# Patient Record
Sex: Female | Born: 1980 | Race: White | Hispanic: No | Marital: Married | Smoking: Never smoker
Health system: Southern US, Community
[De-identification: ages and names within clinical notes are randomized; demographics above are authoritative.]

## PROBLEM LIST (undated history)

## (undated) DIAGNOSIS — R112 Nausea with vomiting, unspecified: Secondary | ICD-10-CM

## (undated) DIAGNOSIS — Z8639 Personal history of other endocrine, nutritional and metabolic disease: Secondary | ICD-10-CM

## (undated) DIAGNOSIS — Z8349 Family history of other endocrine, nutritional and metabolic diseases: Secondary | ICD-10-CM

## (undated) DIAGNOSIS — O09519 Supervision of elderly primigravida, unspecified trimester: Secondary | ICD-10-CM

## (undated) DIAGNOSIS — Z9889 Other specified postprocedural states: Secondary | ICD-10-CM

## (undated) DIAGNOSIS — K429 Umbilical hernia without obstruction or gangrene: Secondary | ICD-10-CM

## (undated) DIAGNOSIS — E162 Hypoglycemia, unspecified: Secondary | ICD-10-CM

## (undated) HISTORY — DX: Other specified postprocedural states: Z98.890

## (undated) HISTORY — PX: WISDOM TOOTH EXTRACTION: SHX21

## (undated) HISTORY — DX: Hypoglycemia, unspecified: E16.2

## (undated) HISTORY — DX: Nausea with vomiting, unspecified: R11.2

## (undated) HISTORY — DX: Umbilical hernia without obstruction or gangrene: K42.9

## (undated) HISTORY — DX: Supervision of elderly primigravida, unspecified trimester: O09.519

## (undated) HISTORY — PX: UMBILICAL HERNIA REPAIR: SHX196

## (undated) HISTORY — DX: Personal history of other endocrine, nutritional and metabolic disease: Z86.39

## (undated) HISTORY — DX: Family history of other endocrine, nutritional and metabolic diseases: Z83.49

## (undated) HISTORY — PX: HERNIA REPAIR: SHX51

## (undated) HISTORY — PX: BACK SURGERY: SHX140

---

## 2006-01-29 ENCOUNTER — Inpatient Hospital Stay (HOSPITAL_COMMUNITY): Admission: AD | Admit: 2006-01-29 | Discharge: 2006-01-31 | Payer: Self-pay | Admitting: Obstetrics and Gynecology

## 2006-04-03 ENCOUNTER — Emergency Department (HOSPITAL_COMMUNITY): Admission: EM | Admit: 2006-04-03 | Discharge: 2006-04-03 | Payer: Self-pay | Admitting: Emergency Medicine

## 2007-02-12 ENCOUNTER — Emergency Department (HOSPITAL_COMMUNITY): Admission: EM | Admit: 2007-02-12 | Discharge: 2007-02-12 | Payer: Self-pay | Admitting: Family Medicine

## 2007-05-15 IMAGING — CT CT ABDOMEN W/O CM
1 series · 15 of 32 positions shown, 19 images · IV contrast (agent unspecified)
Comparison: none

CLINICAL DATA: 8 weeks post partum.  Left flank and left lower quadrant pain.  Please evaluate. 
 ABDOMEN CT WITHOUT CONTRAST:
TECHNIQUE: Multidetector CT imaging of the abdomen was performed following the standard protocol without IV contrast.
TECHNIQUE: Multidetector CT imaging of the pelvis was performed following the standard protocol without IV contrast.

[Series 2: stone_wo 5.0 b40f st · axial · 0.61mm/px · z∈[-444,-92]mm · 15 of 99 slices shown, 19 images]
[im 7/99  soft-tissue]
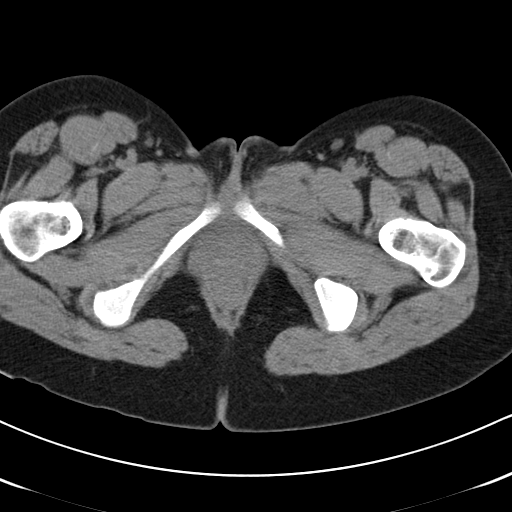
[im 7/99  bone]
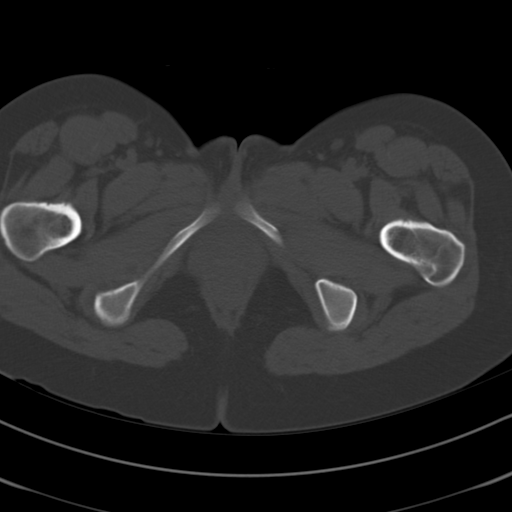
[im 13/99  soft-tissue]
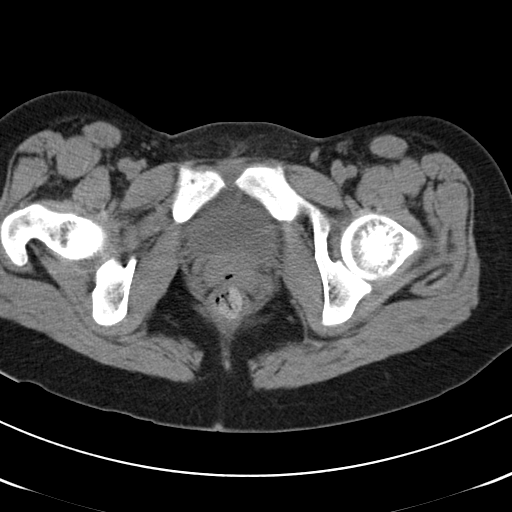
[im 19/99  soft-tissue]
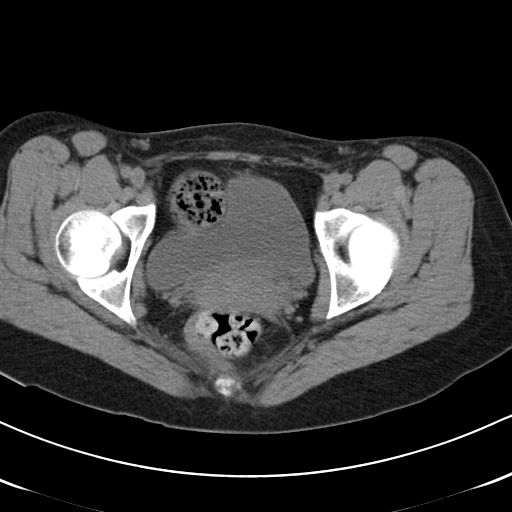
[im 29/99  soft-tissue]
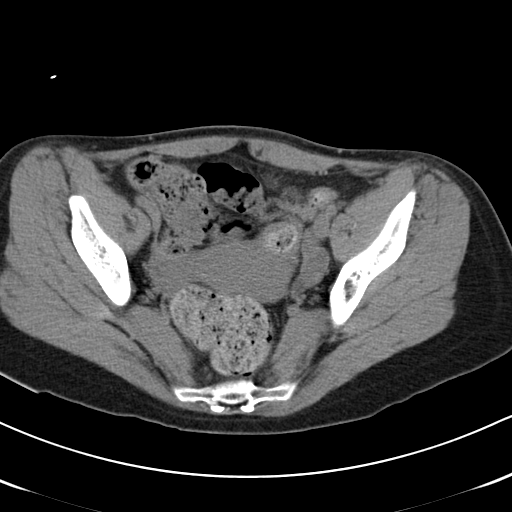
[im 35/99  soft-tissue]
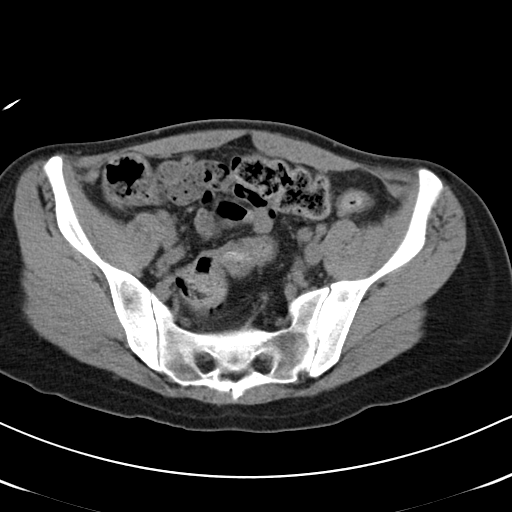
[im 42/99  soft-tissue]
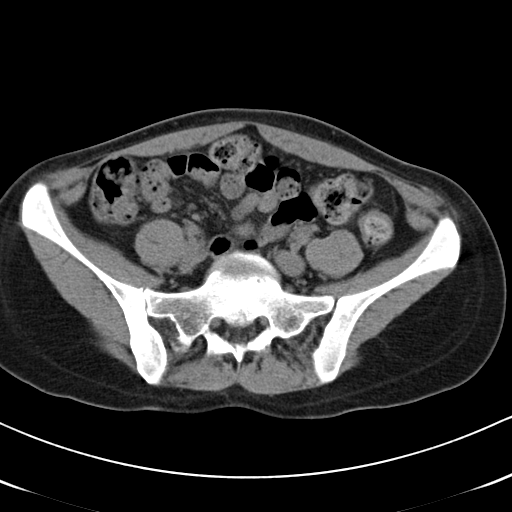
[im 51/99  soft-tissue]
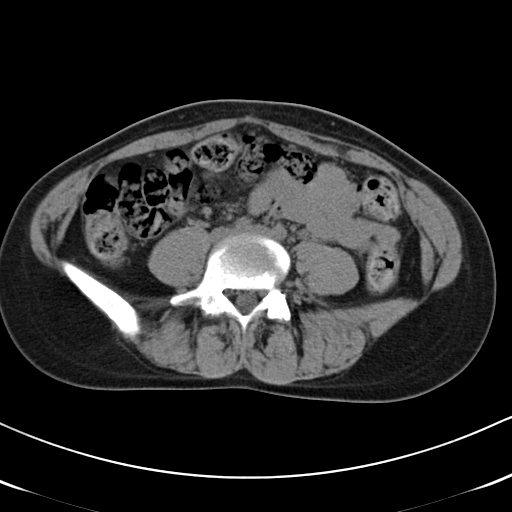
[im 57/99  soft-tissue]
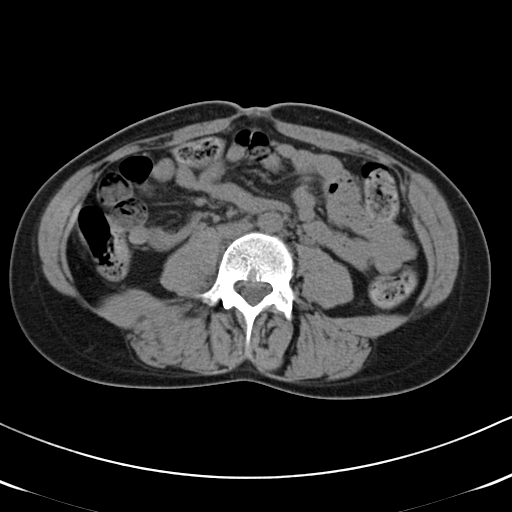
[im 64/99  soft-tissue]
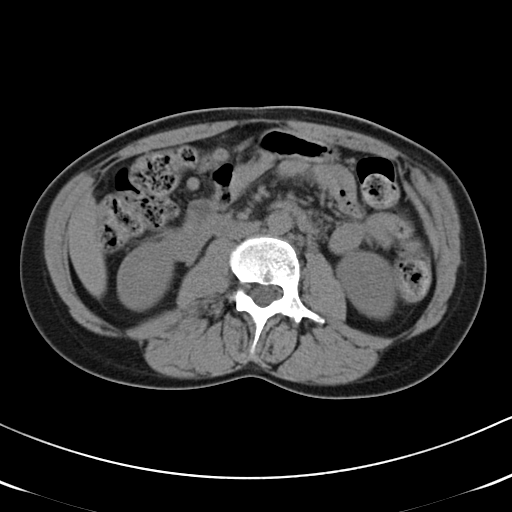
[im 64/99  bone]
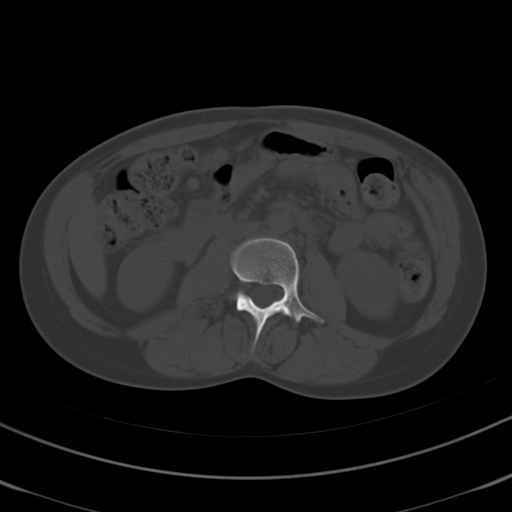
[im 70/99  soft-tissue]
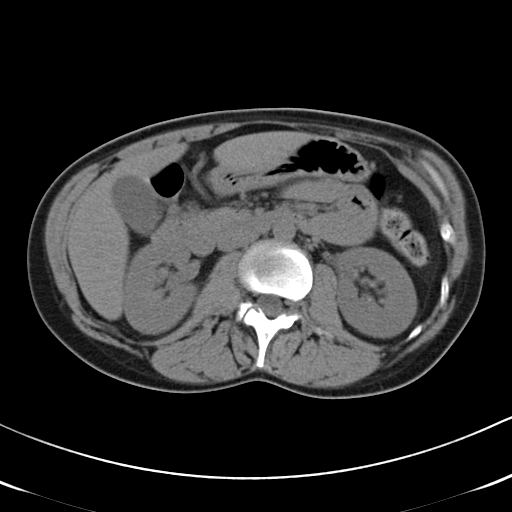
[im 80/99  soft-tissue]
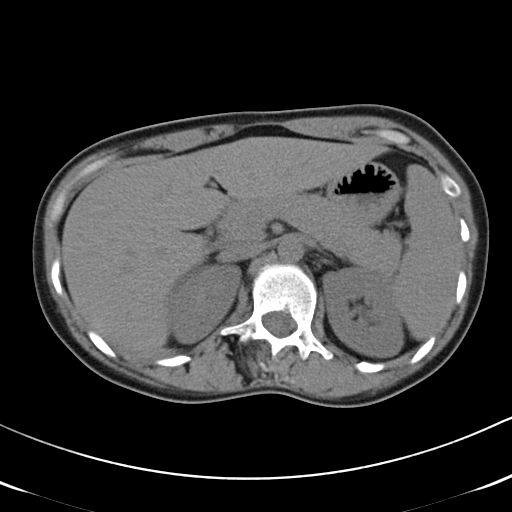
[im 86/99  soft-tissue]
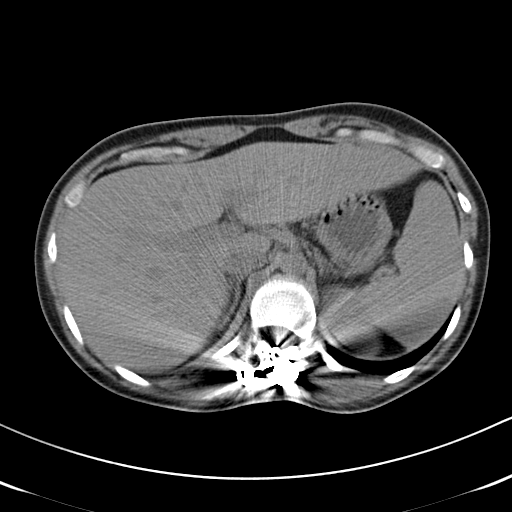
[im 86/99  lung]
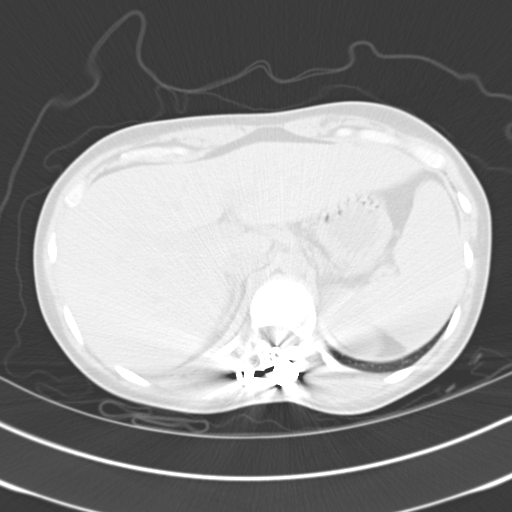
[im 89/99  lung]
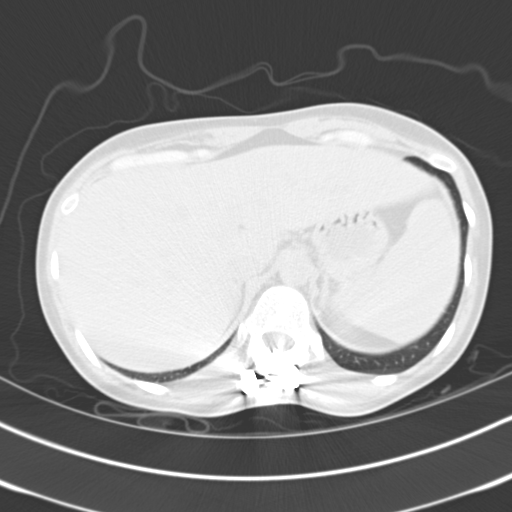
[im 92/99  soft-tissue]
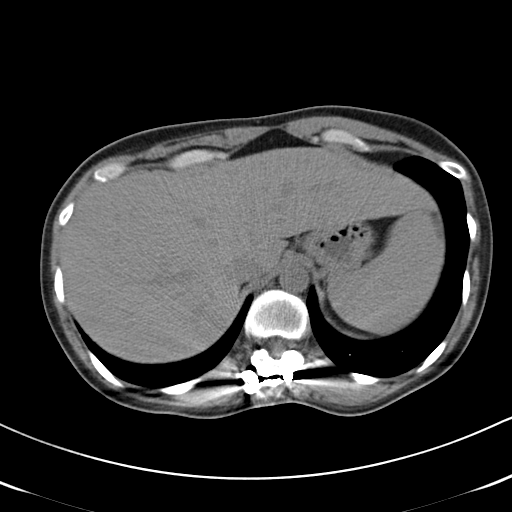
[im 92/99  lung]
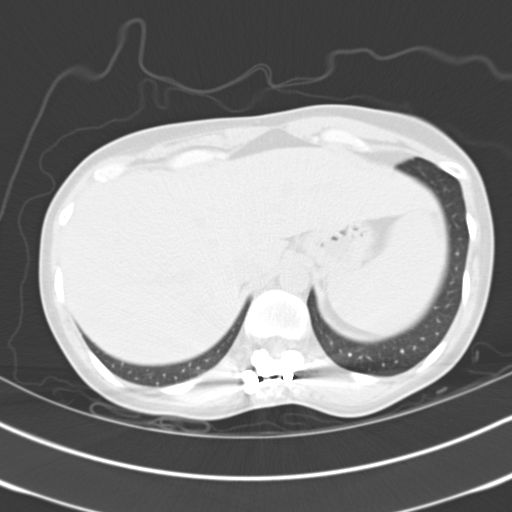
[im 95/99  lung]
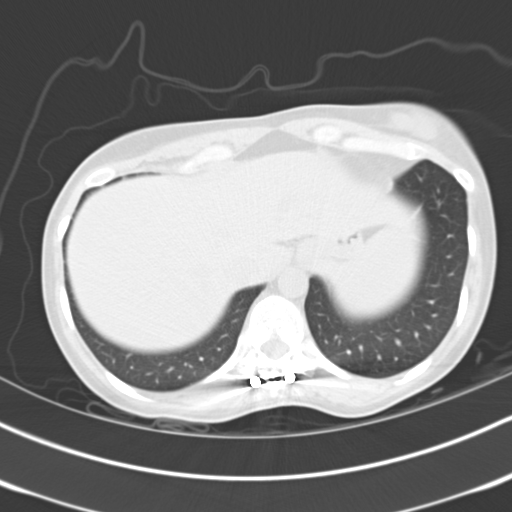

[15 of 32 positions shown; findings below may reference images not displayed]

FINDINGS: There is a 2 mm in size intrarenal left renal calculus.  There is no hydronephrosis.  There are no ureteral calculi.  The visualized portions of the liver, spleen, and pancreas have a normal appearance as do the adrenal glands.  There are Harrington rods noted posteriorly within the thoracic region.
IMPRESSION: 2 mm in size intrarenal left renal calculus.  No hydronephrosis.  No evidence for ureteral calculi.
 PELVIS CT WITHOUT CONTRAST:
FINDINGS: There are no distal ureteral calculi.  There is a tiny (1 mm) density which projects possibly within the posterolateral recess of the bladder on the left (image #82).  Possibly this represents a recently passed ureteral calculus.  There is no pelvic mass or adenopathy.
IMPRESSION: Tiny opaque density projecting in the region of the posterolateral left bladder possibly representing recently passed ureteral calculus.  Otherwise, negative CT of the pelvis.

## 2007-06-26 ENCOUNTER — Inpatient Hospital Stay (HOSPITAL_COMMUNITY): Admission: AD | Admit: 2007-06-26 | Discharge: 2007-06-26 | Payer: Self-pay | Admitting: Obstetrics and Gynecology

## 2007-08-30 ENCOUNTER — Ambulatory Visit (HOSPITAL_COMMUNITY): Admission: RE | Admit: 2007-08-30 | Discharge: 2007-08-30 | Payer: Self-pay | Admitting: General Surgery

## 2008-06-23 ENCOUNTER — Inpatient Hospital Stay (HOSPITAL_COMMUNITY): Admission: RE | Admit: 2008-06-23 | Discharge: 2008-06-25 | Payer: Self-pay | Admitting: Obstetrics

## 2008-08-06 IMAGING — US US OB COMP LESS 14 WK
1 series · 14 of 28 positions shown · non-contrast
Comparison: none

OBSTETRICAL ULTRASOUND:

 This ultrasound exam was performed in the [HOSPITAL] Ultrasound Department.  The OB US report was generated in the AS system, and faxed to the ordering physician.  This report is also available in [REDACTED] PACS.

[Series 1: us ob comp less 14 wk · 0.20mm/px · 42 acquisitions, 14 frames shown]
[im 2/42]
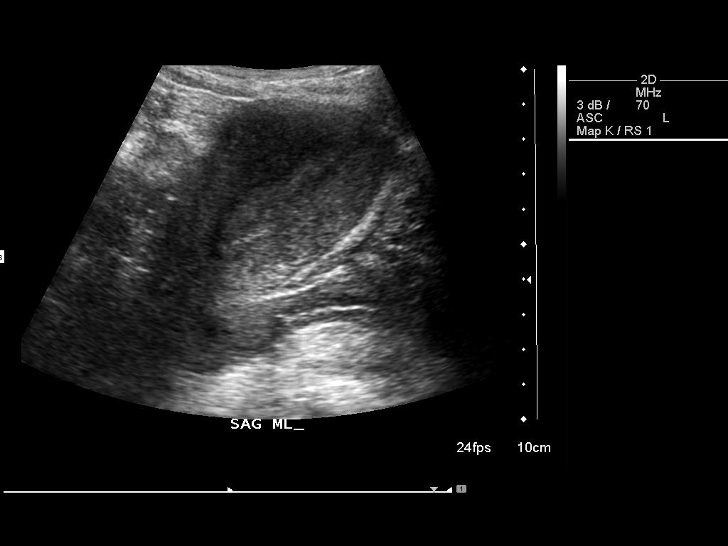
[im 5/42]
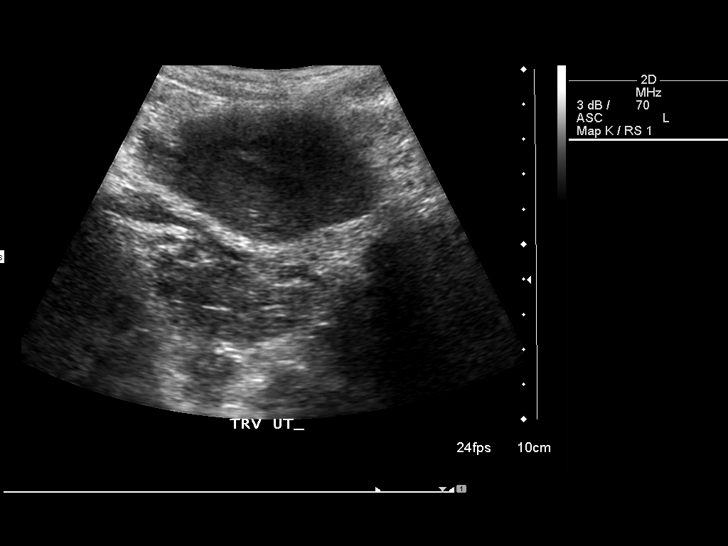
[im 8/42]
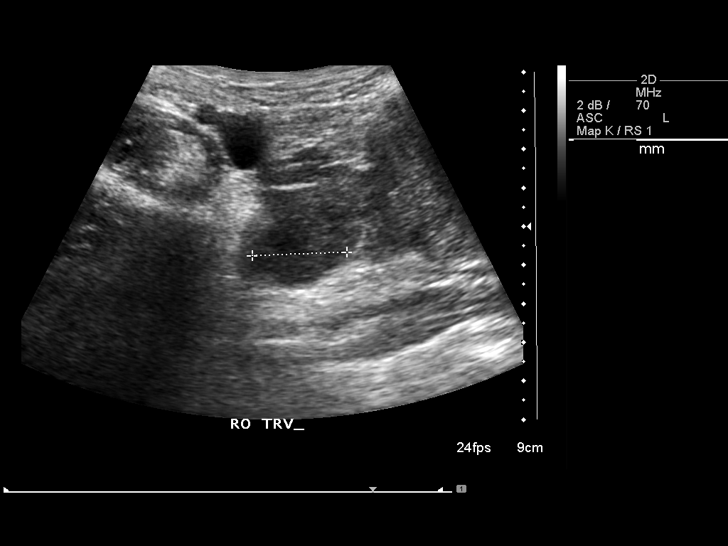
[im 11/42]
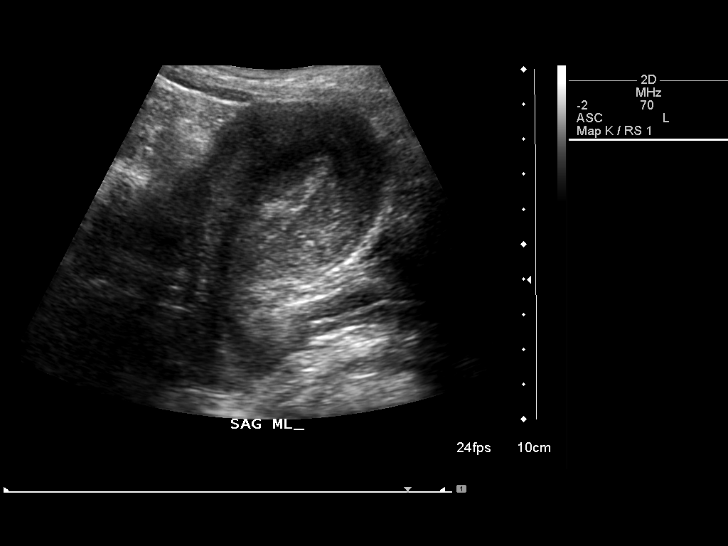
[im 14/42]
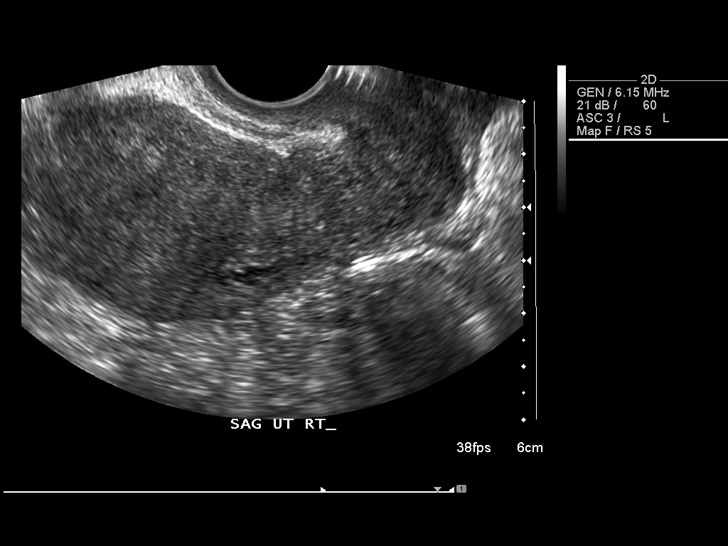
[im 17/42]
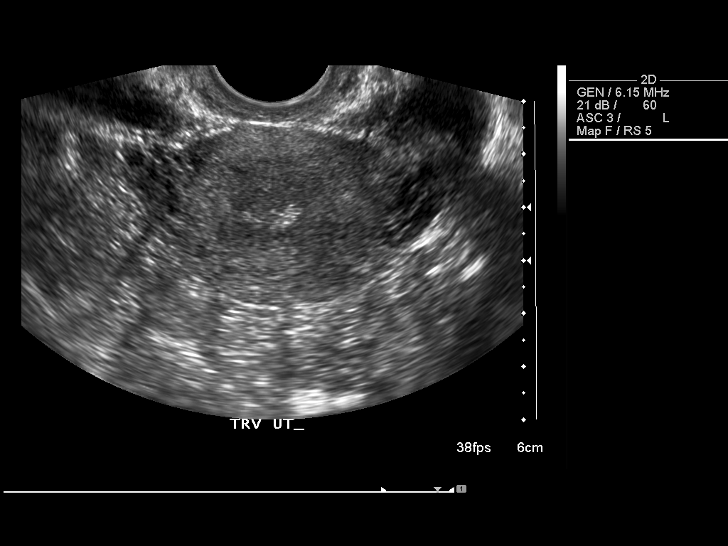
[im 20/42]
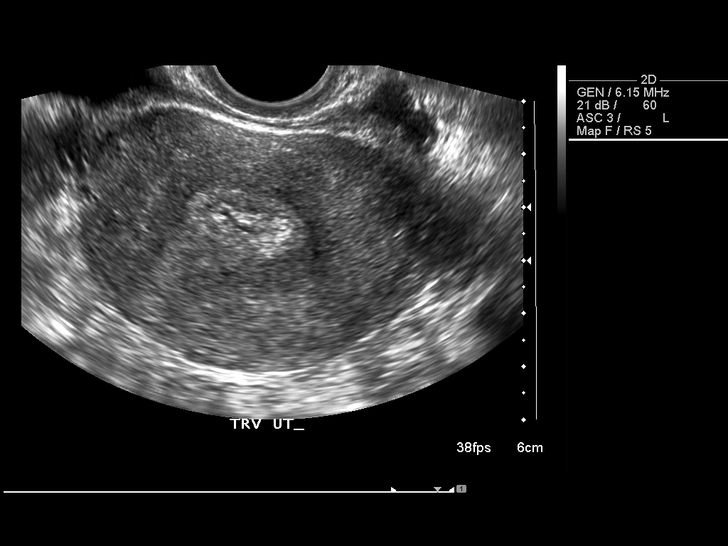
[im 23/42]
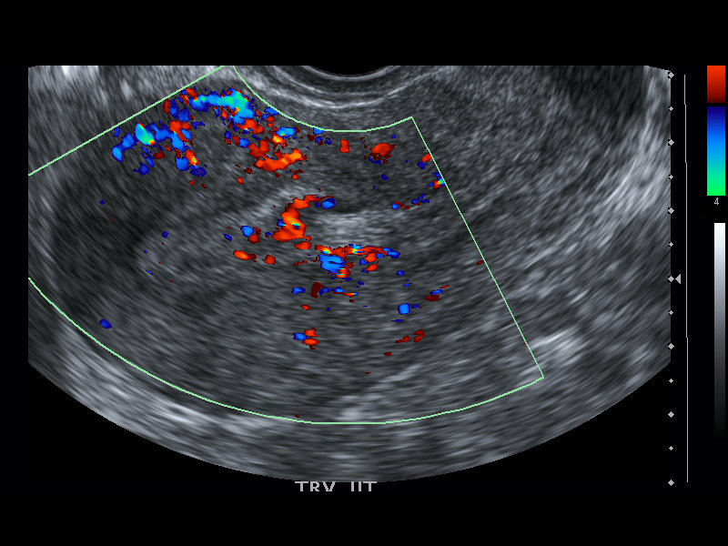
[im 26/42]
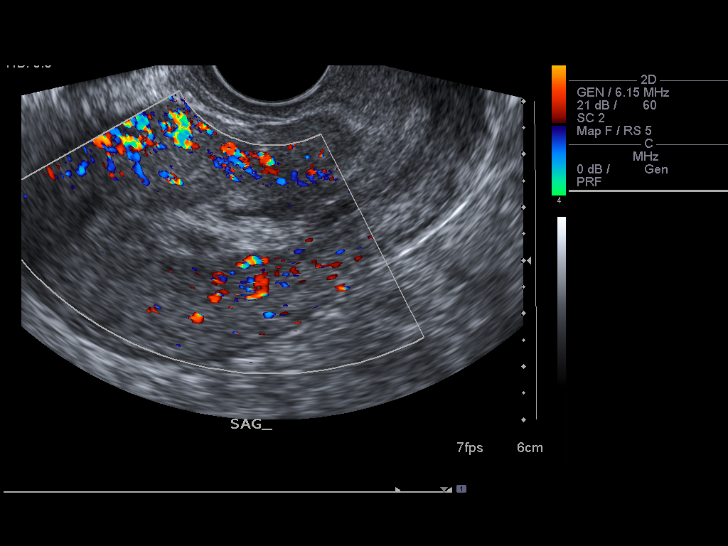
[im 29/42]
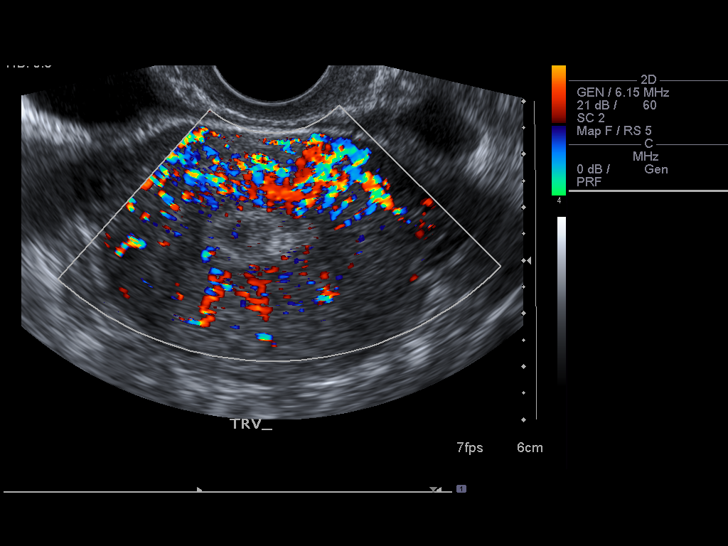
[im 32/42]
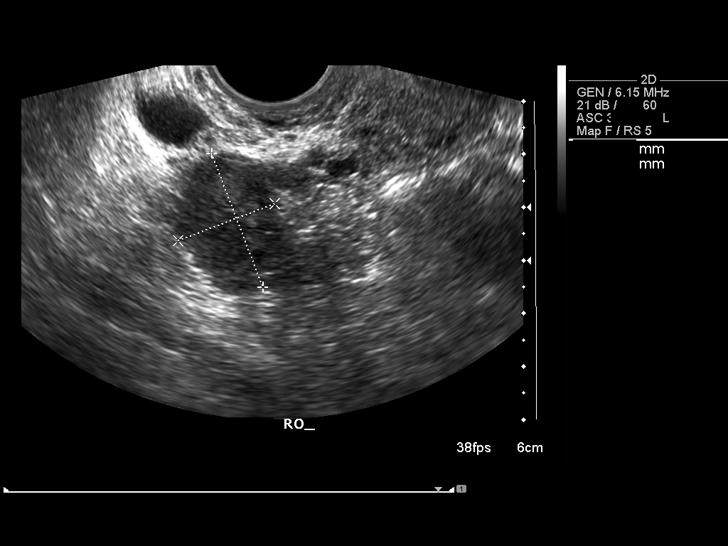
[im 35/42]
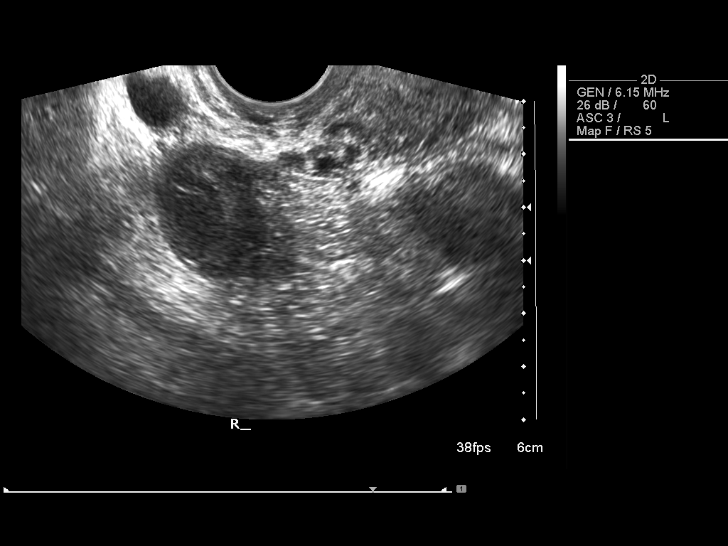
[im 38/42]
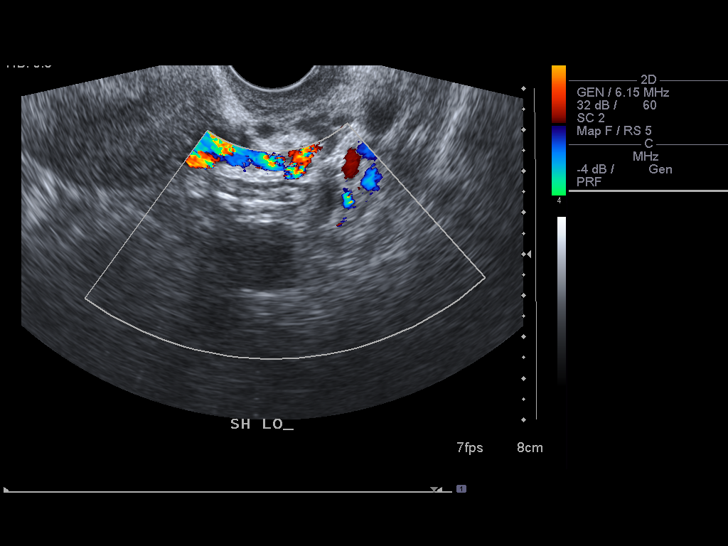
[im 42/42]
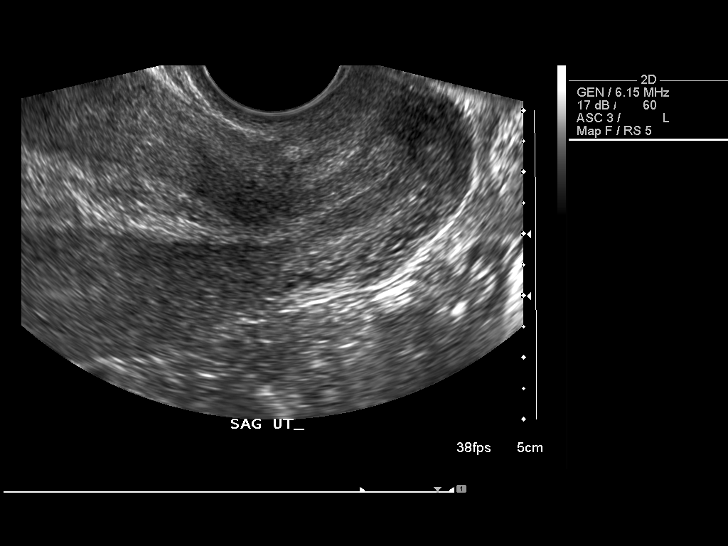

[14 of 28 positions shown; findings below may reference images not displayed]

IMPRESSION: See AS Obstetric US report.

## 2008-12-09 ENCOUNTER — Encounter: Admission: RE | Admit: 2008-12-09 | Discharge: 2008-12-09 | Payer: Self-pay | Admitting: Family Medicine

## 2011-01-31 NOTE — Consult Note (Signed)
[name], [name]          ACCOUNT NO.:  [id number]   MEDICAL RECORD NO.:  [id number]          PATIENT TYPE:  MAT   LOCATION:  [facility]                          FACILITY:  [facility]   PHYSICIAN:  Lenoard Aden, M.D.DATE OF BIRTH:  [birth year]   DATE OF CONSULTATION:  06/26/2007  DATE OF DISCHARGE:                                 CONSULTATION   CHIEF COMPLAINT:  First trimester bleeding.   She is a 30 year old white female G2, P1 at 7 weeks' gestation, 6 weeks'  gestation by previously documented ultrasound who presents with  increased bleeding questionable tissue passage.  She has no known drug  allergies.  Medications are prenatal vitamins.  She is a nonsmoker,  nondrinker.  __________  Uncomplicated vaginal delivery   PAST MEDICAL HISTORY:  1. History of uncomplicated vaginal delivery x1.  2. Current pregnancy complicated by bleeding with questionable tissue      passage today, had an ultrasound in the office 2 days ago      consistent with a viable 5 with __________ which was less than      dates, as predicted.   Today on exam, she is a well-developed, well-nourished white female in  no acute distress.  HEENT:  Normal.  LUNGS:  Clear.  HEART:  Regular rhythm.  ABDOMEN:  Soft, nontender.  Pelvic exam reveals cervix to be closed, no active bleeding noted.  Uterus normal size, shape, and contour.  No adnexal masses.  EXTREMITIES:  No cords.  __________  NEUROLOGIC:  Nonfocal.  Skin is intact.  Ultrasound reveals an empty  uterus with no evidence of retained tissue, and no adnexal masses are  appreciated.  No fetal polar gestational sac is seen.   IMPRESSION:  Probable complete abortion.   PLAN:  1. Discharge home.  2. Bleeding precautions given.  3. Follow weekly hCGs until 0.  4. Reassurance is given.  __________ precautions discussed.  Bleeding      precautions discussed.      Lenoard Aden, M.D.  Electronically Signed     RJT/MEDQ  D:  06/26/2007  T:   06/26/2007  Job:  [id number]

## 2011-01-31 NOTE — Op Note (Signed)
NAMESHARMANE, DAME          ACCOUNT NO.:  [id number]   MEDICAL RECORD NO.:  [id number]          PATIENT TYPE:  AMB   LOCATION:  DAY                          FACILITY:  [facility]   PHYSICIAN:  Ollen Gross. Vernell Morgans, M.D. DATE OF BIRTH:  25-Jun-1981   DATE OF PROCEDURE:  08/30/2007  DATE OF DISCHARGE:                               OPERATIVE REPORT   PREOPERATIVE DIAGNOSIS:  Umbilical hernia.   POSTOPERATIVE DIAGNOSIS:  Umbilical hernia.   PROCEDURE:  Umbilical hernia repair.   SURGEON:  Ollen Gross. Vernell Morgans, M.D.   ANESTHESIA:  General endotracheal.   PROCEDURE IN DETAIL:  After informed consent was obtained the patient  was brought to the operating room and placed in a supine position on the  operating room table.  After adequate induction of general anesthesia  the patient's abdomen was prepped with Betadine and draped in the usual  sterile manner.  A vertically oriented incision was made through the  umbilicus.  This incision was carried down through the skin and  subcutaneous tissue sharply with the electrocautery.  The hernia was  identified.  The hernia contained only some preperitoneal fat.  This  preperitoneal fat was excised sharply with the electrocautery.  This  then revealed the defect which was only a couple millimeters wide.  The  fascia appeared to be intact.  The fascial defect was then closed with  an interrupted #1 Novofil stitch.  The area was then reinforced with a  zero Vicryl stitch.  The area was then infiltrated with 0.25% Marcaine  with epinephrine.  The subcutaneous tissue was then closed with  interrupted 3-0 Vicryl stitches and the skin was closed with interrupted  4-0 Monocryl subcuticular stitches.  Dermabond dressing was then  applied.  The patient tolerated the procedure well.  At the end of the  case all needle, sponge and instrument counts were correct.  The patient  was then awakened and taken to the recovery room in stable condition.      Ollen Gross.  Vernell Morgans, M.D.  Electronically Signed     PST/MEDQ  D:  08/30/2007  T:  08/30/2007  Job:  [id number]

## 2011-06-19 LAB — CBC
HCT: 39
Hemoglobin: 13
Hemoglobin: 9.8 — ABNORMAL LOW
MCHC: 33.4
MCHC: 34.3
MCV: 94.7
Platelets: 163
RDW: 13.5

## 2011-06-19 LAB — GENTAMICIN LEVEL, RANDOM: Gentamicin Rm: 1.5

## 2011-06-19 LAB — RPR: RPR Ser Ql: NONREACTIVE

## 2011-06-26 LAB — PREGNANCY, URINE: Preg Test, Ur: NEGATIVE

## 2013-08-04 LAB — OB RESULTS CONSOLE GC/CHLAMYDIA
CHLAMYDIA, DNA PROBE: NEGATIVE
Gonorrhea: NEGATIVE

## 2013-08-04 LAB — OB RESULTS CONSOLE ABO/RH: RH Type: POSITIVE

## 2013-08-04 LAB — OB RESULTS CONSOLE ANTIBODY SCREEN: Antibody Screen: NEGATIVE

## 2013-08-04 LAB — OB RESULTS CONSOLE RUBELLA ANTIBODY, IGM: Rubella: IMMUNE

## 2013-08-04 LAB — OB RESULTS CONSOLE HEPATITIS B SURFACE ANTIGEN: Hepatitis B Surface Ag: NEGATIVE

## 2013-08-04 LAB — OB RESULTS CONSOLE RPR: RPR: NONREACTIVE

## 2013-08-04 LAB — OB RESULTS CONSOLE HIV ANTIBODY (ROUTINE TESTING): HIV: NONREACTIVE

## 2013-09-18 NOTE — L&D Delivery Note (Signed)
Delivery Note At 10:22 PM a viable female was delivered via Vaginal, Spontaneous Delivery (Presentation: ;LOA  ).  APGAR: , ; weight .   Placenta status: Intact, Spontaneous.  Cord: 3VC with the following complications: .  Cord pH: not indicated  Anesthesia: Epidural  Episiotomy: none Lacerations: 2nd Suture Repair: 2.0 vicryl rapide Est. Blood Loss (mL):   Mom to postpartum.  Baby to Couplet care / Skin to Skin.  Kenidi Elenbaas A. 02/26/2014, 10:44 PM

## 2014-02-12 LAB — OB RESULTS CONSOLE GBS: STREP GROUP B AG: NEGATIVE

## 2014-02-20 ENCOUNTER — Telehealth (HOSPITAL_COMMUNITY): Payer: Self-pay | Admitting: *Deleted

## 2014-02-20 ENCOUNTER — Encounter (HOSPITAL_COMMUNITY): Payer: Self-pay | Admitting: *Deleted

## 2014-02-20 NOTE — Telephone Encounter (Signed)
Preadmission screen  

## 2014-02-24 ENCOUNTER — Other Ambulatory Visit: Payer: Self-pay | Admitting: Obstetrics

## 2014-02-25 NOTE — H&P (Signed)
Brittany [name] is a 33 y.o. [id number] at [redacted]w[redacted]d presenting for IOL for polyhydramnios. Pt notes continued contractions. Good fetal movement, No vaginal bleeding, not leaking fluid.  [facility] at [facility] since 5 wks - Dated by LMP c/w 5 and 8 wk u/s - GBS neg - polyhydramnios. AFi 25/ 99%, recc IOL now that >39 wks. Pt w/ IOL x 2 prior: once for post-dates, once for poly.  - prior SVD x 2, proven to 7'8 - h/o Hashimoto's thyroiditis. One baby with congenital hypothyroidism. Mom w/ stable TSH through preg and on no meds   Prenatal Transfer Tool  Maternal Diabetes: No Genetic Screening: Normal Maternal Ultrasounds/Referrals: Normal Fetal Ultrasounds or other Referrals:  None Maternal Substance Abuse:  No Significant Maternal Medications:  None Significant Maternal Lab Results: None     OB History   Grav Para Term Preterm Abortions TAB SAB Ect Mult Living   4 2 1  1  1   2      Past Medical History  Diagnosis Date  . Hx of Hashimoto thyroiditis   . Elderly primigravida, antepartum   . Family history of other endocrine and metabolic diseases(V18.19)   . Umbilical hernia without mention of obstruction or gangrene   . Hypoglycemia, unspecified    Past Surgical History  Procedure Laterality Date  . Back surgery    . Wisdom tooth extraction    . Hernia repair     Family History: family history includes Asthma in her mother and son; Bipolar disorder in her sister; Birth defects in her maternal grandfather; COPD in her paternal grandfather; Cancer in her father and mother; Depression in her paternal grandmother; Luiz Blare' disease in her maternal grandfather; Heart disease in her paternal grandfather; Hypertension in her father; Hypothyroidism in her maternal grandmother, mother, and sister; Kidney Stones in her father and paternal grandfather; Stroke in her maternal grandfather; Varicose Veins in her maternal grandmother and mother. Social History:  has no tobacco, alcohol, and drug  history on file.  Review of Systems - Negative except contined contractions from polyhydramnios     Last menstrual period 05/27/2013.  Physical Exam:  Filed Vitals:   02/26/14 0713 02/26/14 0744 02/26/14 0746  BP: 118/66    Pulse: 85    Temp: 98.2 F (36.8 C)    TempSrc: Oral    Resp: 16 20 20   Height: 4\' 8"  (1.422 m)    Weight: 68.947 kg (152 lb)     Prenatal labs: ABO, Rh: A/Positive/-- (11/17 0000) Antibody: Negative (11/17 0000) Rubella:  immubne RPR: Nonreactive (11/17 0000)  HBsAg: Negative (11/17 0000)  HIV: Non-reactive (11/17 0000)  GBS: Negative (05/28 0000)  1 hr Glucola 101  Genetic screening nl AFP Anatomy US normal   Assessment/Plan: 33 y.o. [id number] at [redacted]w[redacted]d - polyhydramnios at 39+ wks. Recc IOL, plan pitocin then AROM once engaged. Pt aware risks cord prolpapse and risks of c/s w/ IOL - GBS neg

## 2014-02-26 ENCOUNTER — Encounter (HOSPITAL_COMMUNITY): Payer: Managed Care, Other (non HMO) | Admitting: Anesthesiology

## 2014-02-26 ENCOUNTER — Encounter (HOSPITAL_COMMUNITY): Payer: Self-pay

## 2014-02-26 ENCOUNTER — Inpatient Hospital Stay (HOSPITAL_COMMUNITY): Payer: Managed Care, Other (non HMO) | Admitting: Anesthesiology

## 2014-02-26 ENCOUNTER — Inpatient Hospital Stay (HOSPITAL_COMMUNITY)
Admission: RE | Admit: 2014-02-26 | Discharge: 2014-02-28 | DRG: 775 | Disposition: A | Discharge status: Home / Self Care | Payer: Managed Care, Other (non HMO) | Source: Ambulatory Visit | Attending: Obstetrics | Admitting: Obstetrics

## 2014-02-26 DIAGNOSIS — Z823 Family history of stroke: Secondary | ICD-10-CM

## 2014-02-26 DIAGNOSIS — O409XX0 Polyhydramnios, unspecified trimester, not applicable or unspecified: Principal | ICD-10-CM | POA: Diagnosis present

## 2014-02-26 DIAGNOSIS — Z8249 Family history of ischemic heart disease and other diseases of the circulatory system: Secondary | ICD-10-CM

## 2014-02-26 DIAGNOSIS — O09529 Supervision of elderly multigravida, unspecified trimester: Secondary | ICD-10-CM | POA: Diagnosis present

## 2014-02-26 LAB — CBC
HCT: 35.9 % — ABNORMAL LOW (ref 36.0–46.0)
Hemoglobin: 12.2 g/dL (ref 12.0–15.0)
MCH: 31.4 pg (ref 26.0–34.0)
MCHC: 34 g/dL (ref 30.0–36.0)
MCV: 92.3 fL (ref 78.0–100.0)
Platelets: 168 10*3/uL (ref 150–400)
RBC: 3.89 MIL/uL (ref 3.87–5.11)
RDW: 13.8 % (ref 11.5–15.5)
WBC: 6.9 10*3/uL (ref 4.0–10.5)

## 2014-02-26 LAB — TYPE AND SCREEN
ABO/RH(D): A POS
Antibody Screen: NEGATIVE

## 2014-02-26 LAB — RPR

## 2014-02-26 LAB — ABO/RH: ABO/RH(D): A POS

## 2014-02-26 MED ORDER — DIPHENHYDRAMINE HCL 50 MG/ML IJ SOLN: 12.5000 mg | INTRAMUSCULAR | Status: DC | PRN

## 2014-02-26 MED ORDER — ZOLPIDEM TARTRATE 5 MG PO TABS: 5.0000 mg | ORAL_TABLET | Freq: Every evening | ORAL | Status: DC | PRN

## 2014-02-26 MED ORDER — FENTANYL 2.5 MCG/ML BUPIVACAINE 1/10 % EPIDURAL INFUSION (WH - ANES)
INTRAMUSCULAR | Status: AC
  Administered 2014-02-26: 14 mL/h via EPIDURAL
  Filled 2014-02-26: qty 125

## 2014-02-26 MED ORDER — SODIUM BICARBONATE 8.4 % IV SOLN
INTRAVENOUS | Status: DC | PRN
  Administered 2014-02-26: 10 mL via EPIDURAL

## 2014-02-26 MED ORDER — EPHEDRINE 5 MG/ML INJ
INTRAVENOUS | Status: AC
  Filled 2014-02-26: qty 4

## 2014-02-26 MED ORDER — LIDOCAINE HCL (PF) 1 % IJ SOLN
30.0000 mL | INTRAMUSCULAR | Status: DC | PRN
  Filled 2014-02-26: qty 30

## 2014-02-26 MED ORDER — EPHEDRINE 5 MG/ML INJ
10.0000 mg | INTRAVENOUS | Status: DC | PRN
  Filled 2014-02-26: qty 2

## 2014-02-26 MED ORDER — LACTATED RINGERS IV SOLN: 500.0000 mL | INTRAVENOUS | Status: DC | PRN

## 2014-02-26 MED ORDER — PHENYLEPHRINE 40 MCG/ML (10ML) SYRINGE FOR IV PUSH (FOR BLOOD PRESSURE SUPPORT)
80.0000 ug | PREFILLED_SYRINGE | INTRAVENOUS | Status: AC | PRN
  Administered 2014-02-26 (×3): 80 ug via INTRAVENOUS
  Filled 2014-02-26: qty 10

## 2014-02-26 MED ORDER — PHENYLEPHRINE 40 MCG/ML (10ML) SYRINGE FOR IV PUSH (FOR BLOOD PRESSURE SUPPORT)
PREFILLED_SYRINGE | INTRAVENOUS | Status: AC
  Administered 2014-02-26: 80 ug
  Filled 2014-02-26: qty 10

## 2014-02-26 MED ORDER — LACTATED RINGERS IV SOLN
500.0000 mL | Freq: Once | INTRAVENOUS | Status: AC
  Administered 2014-02-26: 500 mL via INTRAVENOUS

## 2014-02-26 MED ORDER — OXYTOCIN 40 UNITS IN LACTATED RINGERS INFUSION - SIMPLE MED
1.0000 m[IU]/min | INTRAVENOUS | Status: DC
  Administered 2014-02-26: 1 m[IU]/min via INTRAVENOUS

## 2014-02-26 MED ORDER — OXYTOCIN 40 UNITS IN LACTATED RINGERS INFUSION - SIMPLE MED
62.5000 mL/h | INTRAVENOUS | Status: DC
  Filled 2014-02-26: qty 1000

## 2014-02-26 MED ORDER — ONDANSETRON HCL 4 MG/2ML IJ SOLN: 4.0000 mg | Freq: Four times a day (QID) | INTRAMUSCULAR | Status: DC | PRN

## 2014-02-26 MED ORDER — OXYTOCIN BOLUS FROM INFUSION
500.0000 mL | INTRAVENOUS | Status: DC
  Administered 2014-02-26: 500 mL via INTRAVENOUS

## 2014-02-26 MED ORDER — LIDOCAINE HCL (PF) 1 % IJ SOLN
INTRAMUSCULAR | Status: DC | PRN
Start: 2014-02-26 — End: 2014-03-02
  Administered 2014-02-26: 10 mL
  Administered 2014-02-26 (×2): 5 mL

## 2014-02-26 MED ORDER — FENTANYL 2.5 MCG/ML BUPIVACAINE 1/10 % EPIDURAL INFUSION (WH - ANES): 14.0000 mL/h | INTRAMUSCULAR | Status: DC | PRN

## 2014-02-26 MED ORDER — PHENYLEPHRINE 40 MCG/ML (10ML) SYRINGE FOR IV PUSH (FOR BLOOD PRESSURE SUPPORT)
PREFILLED_SYRINGE | INTRAVENOUS | Status: AC
  Administered 2014-02-26: 80 ug
  Filled 2014-02-26: qty 5

## 2014-02-26 MED ORDER — ACETAMINOPHEN 325 MG PO TABS: 650.0000 mg | ORAL_TABLET | ORAL | Status: DC | PRN

## 2014-02-26 MED ORDER — PHENYLEPHRINE 40 MCG/ML (10ML) SYRINGE FOR IV PUSH (FOR BLOOD PRESSURE SUPPORT)
80.0000 ug | PREFILLED_SYRINGE | INTRAVENOUS | Status: AC | PRN
  Administered 2014-02-26 (×3): 80 ug via INTRAVENOUS

## 2014-02-26 MED ORDER — CITRIC ACID-SODIUM CITRATE 334-500 MG/5ML PO SOLN: 30.0000 mL | ORAL | Status: DC | PRN

## 2014-02-26 MED ORDER — OXYCODONE-ACETAMINOPHEN 5-325 MG PO TABS: 1.0000 | ORAL_TABLET | ORAL | Status: DC | PRN

## 2014-02-26 MED ORDER — IBUPROFEN 600 MG PO TABS
600.0000 mg | ORAL_TABLET | Freq: Four times a day (QID) | ORAL | Status: DC | PRN
  Administered 2014-02-27: 600 mg via ORAL
  Filled 2014-02-26: qty 1

## 2014-02-26 MED ORDER — LACTATED RINGERS IV SOLN
INTRAVENOUS | Status: DC
  Administered 2014-02-26 (×2): 125 mL/h via INTRAVENOUS
  Administered 2014-02-26: 21:00:00 via INTRAVENOUS

## 2014-02-26 MED ORDER — TERBUTALINE SULFATE 1 MG/ML IJ SOLN: 0.2500 mg | Freq: Once | INTRAMUSCULAR | Status: AC | PRN

## 2014-02-26 NOTE — Progress Notes (Signed)
Attempted to call Dr Ernestina Penna on cell phone [phone]) x [phone] unable to reach doctor to report pt hurting and about to get epidural.  Fabian November CNM called and notified of attempts to update MD

## 2014-02-26 NOTE — Progress Notes (Signed)
S: Doing well, no complaints, pain well controlled without epidural, though planning. S/p anasthesia consult due to Harrington rods. Pt notes feels crampy but not having uncomfortable ctx, no LOF  O: BP 112/68  Pulse 65  Temp(Src) 98.3 F (36.8 C) (Oral)  Resp 18  Ht 5\' 6"  (1.676 m)  Wt 68.947 kg (152 lb)  BMI 24.55 kg/m2  LMP 05/27/2013   FHT:  FHR: 130s bpm, variability: moderate,  accelerations:  Present,  decelerations:  Absent UC:   regular, every 3-4 minutes, pitocin at 3 SVE:   Dilation: Fingertip Effacement (%): 50 Station: -3 Exam by:: Dr Ernestina Penna Attempt to AROM but unable to pass hook through cvx   A / P:  33 y.o.  Obstetric History   G4   P2   T2   P0   A1   TAB0   SAB1   E0   M0   L2    at [redacted]w[redacted]d IOL due to poly, need to get into labor. recc to increase pitocin Fetal Wellbeing:  Category I Pain Control:  Labor support without medications  Anticipated MOD:  NSVD  Nicholous Girgenti A. 02/26/2014, 12:45 PM

## 2014-02-26 NOTE — Progress Notes (Signed)
Drs Sherron Ales and Sheral Apley in room evaluating the epidural

## 2014-02-26 NOTE — Progress Notes (Signed)
Pt very anxious durring entire epidural placement x 2.  Pt hyperventilating.

## 2014-02-26 NOTE — Progress Notes (Signed)
S: Doing well, no complaints, pain well controlled planning epidural though ctx not yet strong  O: BP 111/61  Pulse 74  Temp(Src) 97.8 F (36.6 C) (Oral)  Resp 16  Ht 5\' 6"  (1.676 m)  Wt 68.947 kg (152 lb)  BMI 24.55 kg/m2  LMP 05/27/2013   FHT:  FHR: 140s bpm, variability: moderate,  accelerations:  Present,  decelerations:  Absent UC:   regular, every 3 minutes SVE:   Dilation: 2 Effacement (%): 20 Station: -3 Exam by:: Dr Ernestina Penna AROM clear/ poly  A / P:  33 y.o.  Obstetric History   G4   P2   T2   P0   A1   TAB0   SAB1   E0   M0   L2    at [redacted]w[redacted]d Induction of labor due to polyhydramnios,  progressing well on pitocin Expect more change now that AROM  Fetal Wellbeing:  Category I Pain Control:  Labor support without medications  Anticipated MOD:  NSVD  Brittany [name] A. 02/26/2014, 4:27 PM

## 2014-02-26 NOTE — Progress Notes (Signed)
Pt still hurting. Pt in process positioning self for epidural to be replaced.  Pt told MD toes are starting to tingle.  MD re-dosed pt

## 2014-02-26 NOTE — Progress Notes (Signed)
S: Doing well, no complaints, pain well controlled with epidural  O: BP 101/54  Pulse 85  Temp(Src) 98.1 F (36.7 C) (Oral)  Resp 18  Ht 5\' 6"  (1.676 m)  Wt 68.947 kg (152 lb)  BMI 24.55 kg/m2  SpO2 98%  LMP 05/27/2013   FHT:  FHR: 130s bpm, variability: moderate,  accelerations:  Present,  decelerations:  Absent UC:   regular, every 3 minutes SVE:   Dilation: 4 Effacement (%): 80 Station: -2;-1 Exam by:: Gardiner Coins RN   A / P:  33 y.o.  Obstetric History   G4   P2   T2   P0   A1   TAB0   SAB1   E0   M0   L2    at [redacted]w[redacted]d IOL for poly, appropriate progress  Fetal Wellbeing:  Category I Pain Control:  Epidural  Anticipated MOD:  NSVD  Markell Sciascia A. 02/26/2014, 9:12 PM

## 2014-02-26 NOTE — Progress Notes (Signed)
Dr Jean Rosenthal in room discussing pt's concerns regarding epidural

## 2014-02-26 NOTE — Progress Notes (Signed)
Requested Dr Jean Rosenthal to come and talk to pt concerning epidural

## 2014-02-26 NOTE — Anesthesia Procedure Notes (Addendum)
Epidural Patient location during procedure: OB  Preanesthetic Checklist Completed: patient identified, site marked, surgical consent, pre-op evaluation, timeout performed, IV checked, risks and benefits discussed and monitors and equipment checked  Epidural Patient position: sitting Prep: site prepped and draped and DuraPrep Patient monitoring: continuous pulse ox and blood pressure Approach: midline Injection technique: LOR air  Needle:  Needle type: Tuohy  Needle gauge: 17 G Needle length: 9 cm and 9 Needle insertion depth: 5 cm cm Catheter type: closed end flexible Catheter size: 19 Gauge Catheter at skin depth: 10 cm Test dose: negative  Assessment Events: blood not aspirated, injection not painful, no injection resistance, negative IV test and no paresthesia  Additional Notes Dosing of Epidural:  1st dose, through catheter ............................................Marland Kitchen  Xylocaine 40 mg  2nd dose, through catheter, after waiting 3 minutes........Marland KitchenXylocaine 60 mg    ( 1% Xylo charted as a single dose in Epic Meds for ease of charting; actual dosing was fractionated as above, for saftey's sake)  As each dose occurred, patient was free of IV sx; and patient exhibited no evidence of SA injection.  Patient is more comfortable after epidural dosed. Please see RN's note for documentation of vital signs,and FHR which are stable.  Patient reminded not to try to ambulate with numb legs, and that an RN must be present when she attempts to get up.      Epidural Patient location during procedure: OB Start time: 02/26/2014 7:10 PM  Staffing Anesthesiologist: Brayton Caves Performed by: anesthesiologist   Preanesthetic Checklist Completed: patient identified, site marked, surgical consent, pre-op evaluation, timeout performed, IV checked, risks and benefits discussed and monitors and equipment checked  Epidural Patient position: sitting Prep: site prepped and draped  and DuraPrep Patient monitoring: continuous pulse ox and blood pressure Approach: midline Location: L2-L3 Injection technique: LOR air  Needle:  Needle type: Tuohy  Needle gauge: 17 G Needle length: 9 cm and 9 Needle insertion depth: 4 cm Catheter type: closed end flexible Catheter size: 19 Gauge Catheter at skin depth: 10 cm Test dose: negative  Assessment Events: blood not aspirated, injection not painful, no injection resistance, negative IV test and no paresthesia  Additional Notes Patient identified.  Risk benefits discussed including failed block, incomplete pain control, headache, nerve damage, paralysis, blood pressure changes, nausea, vomiting, reactions to medication both toxic or allergic, and postpartum back pain.  Patient expressed understanding and wished to proceed.  All questions were answered.  Sterile technique used throughout procedure and epidural site dressed with sterile barrier dressing. No paresthesia or other complications noted.The patient did not experience any signs of intravascular injection such as tinnitus or metallic taste in mouth nor signs of intrathecal spread such as rapid motor block. Please see nursing notes for vital signs.

## 2014-02-26 NOTE — Anesthesia Preprocedure Evaluation (Signed)

## 2014-02-26 NOTE — Progress Notes (Signed)
Notified md of admission MD will place orders

## 2014-02-27 ENCOUNTER — Encounter (HOSPITAL_COMMUNITY): Payer: Self-pay

## 2014-02-27 MED ORDER — PRENATAL MULTIVITAMIN CH
1.0000 | ORAL_TABLET | Freq: Every day | ORAL | Status: DC
  Administered 2014-02-27 – 2014-02-28 (×2): 1 via ORAL
  Filled 2014-02-27 (×2): qty 1

## 2014-02-27 MED ORDER — TETANUS-DIPHTH-ACELL PERTUSSIS 5-2.5-18.5 LF-MCG/0.5 IM SUSP: 0.5000 mL | Freq: Once | INTRAMUSCULAR | Status: DC

## 2014-02-27 MED ORDER — WITCH HAZEL-GLYCERIN EX PADS
1.0000 "application " | MEDICATED_PAD | CUTANEOUS | Status: DC | PRN
  Administered 2014-02-27: 1 via TOPICAL

## 2014-02-27 MED ORDER — IBUPROFEN 600 MG PO TABS
600.0000 mg | ORAL_TABLET | Freq: Four times a day (QID) | ORAL | Status: DC
  Administered 2014-02-27 – 2014-02-28 (×6): 600 mg via ORAL
  Filled 2014-02-27 (×7): qty 1

## 2014-02-27 MED ORDER — FLEET ENEMA 7-19 GM/118ML RE ENEM: 1.0000 | ENEMA | Freq: Every day | RECTAL | Status: DC | PRN

## 2014-02-27 MED ORDER — LANOLIN HYDROUS EX OINT: TOPICAL_OINTMENT | CUTANEOUS | Status: DC | PRN

## 2014-02-27 MED ORDER — DIBUCAINE 1 % RE OINT
1.0000 "application " | TOPICAL_OINTMENT | RECTAL | Status: DC | PRN
  Administered 2014-02-27: 1 via RECTAL
  Filled 2014-02-27: qty 28

## 2014-02-27 MED ORDER — OXYCODONE-ACETAMINOPHEN 5-325 MG PO TABS: 1.0000 | ORAL_TABLET | ORAL | Status: DC | PRN

## 2014-02-27 MED ORDER — DIPHENHYDRAMINE HCL 25 MG PO CAPS: 25.0000 mg | ORAL_CAPSULE | Freq: Four times a day (QID) | ORAL | Status: DC | PRN

## 2014-02-27 MED ORDER — SENNOSIDES-DOCUSATE SODIUM 8.6-50 MG PO TABS: 2.0000 | ORAL_TABLET | ORAL | Status: DC

## 2014-02-27 MED ORDER — ZOLPIDEM TARTRATE 5 MG PO TABS: 5.0000 mg | ORAL_TABLET | Freq: Every evening | ORAL | Status: DC | PRN

## 2014-02-27 MED ORDER — BISACODYL 10 MG RE SUPP: 10.0000 mg | Freq: Every day | RECTAL | Status: DC | PRN

## 2014-02-27 MED ORDER — ONDANSETRON HCL 4 MG PO TABS: 4.0000 mg | ORAL_TABLET | ORAL | Status: DC | PRN

## 2014-02-27 MED ORDER — ONDANSETRON HCL 4 MG/2ML IJ SOLN: 4.0000 mg | INTRAMUSCULAR | Status: DC | PRN

## 2014-02-27 MED ORDER — BENZOCAINE-MENTHOL 20-0.5 % EX AERO
1.0000 "application " | INHALATION_SPRAY | CUTANEOUS | Status: DC | PRN
  Administered 2014-02-27: 1 via TOPICAL
  Filled 2014-02-27: qty 56

## 2014-02-27 MED ORDER — SIMETHICONE 80 MG PO CHEW: 80.0000 mg | CHEWABLE_TABLET | ORAL | Status: DC | PRN

## 2014-02-27 NOTE — Progress Notes (Signed)
Patient ID: Brittany [name], female   DOB: 10/25/1980, 33 y.o.   MRN: [id number] PPD # 1 SVD  S:  Reports feeling well             Tolerating po/ No nausea or vomiting             Bleeding is light             Pain controlled with ibuprofen (OTC)             Up ad lib / ambulatory / voiding without difficulties    Newborn  Information for the patient's newborn:  Brittany [name], [name] [[id number]][[id number]]  female  breast feeding  with no difficulties / Circumcision planning   O:  A & O x 3, in no apparent distress              VS:  Filed Vitals:   02/27/14 0038 02/27/14 0114 02/27/14 0215 02/27/14 0616  BP: 107/59 104/71 102/71 97/71  Pulse: 88 85 66 64  Temp:  99.9 F (37.7 C) 98.9 F (37.2 C) 98.5 F (36.9 C)  TempSrc:  Oral Oral Oral  Resp: 18 18 18 18   Height:      Weight:      SpO2:        LABS:  Recent Labs  02/26/14 0725  WBC 6.9  HGB 12.2  HCT 35.9*  PLT 168    Blood type: --/--/A POS, A POS (06/11 0725)  Rubella: Immune (11/17 0000)   I&O: I/O last 3 completed shifts: In: -  Out: 1400 [Urine:1050; Blood:350]             Lungs: Clear and unlabored  Heart: regular rate and rhythm / no murmurs  Abdomen: soft, non-tender, non-distended              Fundus: firm, non-tender, U-2 - deviated to patient's Lt d/t full bladder  Perineum: 2nd degree repair healing well  Lochia: light  Extremities: no edema, no calf pain or tenderness, no Homans    A/P: PPD # 1  33 y.o., [id number]   Principal Problem:    Postpartum care following vaginal delivery (6/11)   Doing well - stable status  Routine post partum orders  Anticipate discharge tomorrow    Brittany [name], Brittany [name], M, MSN, CNM 02/27/2014, 9:16 AM

## 2014-02-27 NOTE — Lactation Note (Signed)
This note was copied from the chart of Boy Mariana Kolden. Lactation Consultation Note  Patient Name: Boy Dannette BarbaraMelonie Cosper ZOXWR'UToday's Date: 02/27/2014 Reason for consult: Initial assessment  Infant 22.5 hrs old and has breastfed x7 (10-30 min) + x1 (5 min) + attempts x3; voids-4; stools-4.  Mom denies any problems and denies any pain.  Mom is experienced x2 children 3 months each.  Educated on cluster feeding, feeding with cues, and size of infant's stomach.  Encouraged to feed with cues.  Lactation brochure given and informed of outpatient services and hospital support group and community support.  Parents very pleasant.  Encouraged to call for assistance as needed.     Maternal Data Formula Feeding for Exclusion: No Infant to breast within first hour of birth: Yes Does the patient have breastfeeding experience prior to this delivery?: Yes  Feeding Feeding Type: Breast Fed Length of feed: 10 min  LATCH Score/Interventions                      Lactation Tools Discussed/Used WIC Program: No   Consult Status Consult Status: Follow-up Date: 02/28/14 Follow-up type: In-patient    Lendon KaVann, Akiya Morr Walker 02/27/2014, 9:01 PM

## 2014-02-27 NOTE — Anesthesia Postprocedure Evaluation (Signed)
  Anesthesia Post-op Note  Patient: Brittany [name]  Procedure(s) Performed: * No procedures listed *  Patient Location: PACU and Mother/Baby  Anesthesia Type:Epidural  Level of Consciousness: awake, alert  and oriented  Airway and Oxygen Therapy: Patient Spontanous Breathing  Post-op Pain: mild  Post-op Assessment: Post-op Vital signs reviewed, Patient's Cardiovascular Status Stable, Respiratory Function Stable, No signs of Nausea or vomiting, Adequate PO intake, Pain level controlled, No headache, No backache, No residual numbness and No residual motor weakness  Post-op Vital Signs: Reviewed and stable  Last Vitals:  Filed Vitals:   02/27/14 0616  BP: 97/71  Pulse: 64  Temp: 36.9 C  Resp: 18    Complications: No apparent anesthesia complications

## 2014-02-28 MED ORDER — IBUPROFEN 600 MG PO TABS: 600.0000 mg | ORAL_TABLET | Freq: Four times a day (QID) | ORAL | Status: DC

## 2014-02-28 NOTE — Discharge Summary (Signed)
Obstetric Discharge Summary Reason for Admission: induction of labor and for polyhydramnios Prenatal Procedures: NST and ultrasound Intrapartum Procedures: spontaneous vaginal delivery Postpartum Procedures: none Complications-Operative and Postpartum: 2nd  degree perineal laceration Hemoglobin  Date Value Ref Range Status  02/26/2014 12.2  12.0 - 15.0 g/dL Final     HCT  Date Value Ref Range Status  02/26/2014 35.9* 36.0 - 46.0 % Final    Physical Exam:  General: alert and cooperative Lochia: appropriate Uterine Fundus: firm Incision: healing well, no significant drainage, no dehiscence, no significant erythema DVT Evaluation: No evidence of DVT seen on physical exam. Negative Homan's sign. No cords or calf tenderness. No significant calf/ankle edema.  Discharge Diagnoses: Term Pregnancy-delivered  Discharge Information: Date: 02/28/2014 Activity: pelvic rest Diet: routine Medications: PNV and Ibuprofen Condition: stable Instructions: refer to practice specific booklet Discharge to: home Follow-up Information   [facility] Schedule an appointment as soon as possible for a visit in 6 weeks.   Specialty:  Obstetrics and Gynecology   Contact information:   [address] Severe[address] [place] [address] [phone]       Newborn Data: Live born female on 02/26/14 Birth Weight: 7 lb 13.4 oz (3555 g) APGAR: 9, 9  Home with mother.  Brittany [name], N 02/28/2014, 12:43 PM

## 2014-02-28 NOTE — Progress Notes (Signed)
PPD #2- SVD  Subjective:   Reports feeling well Tolerating po/ No nausea or vomiting Bleeding is light Pain controlled with Motrin Up ad lib / ambulatory / voiding without problems Newborn: breastfeeding  / Circumcision: done   Objective:   VS: VS:  Filed Vitals:   02/27/14 0616 02/27/14 1200 02/27/14 1850 02/28/14 0607  BP: 97/71 102/75 99/62 92/58   Pulse: 64 70 75 74  Temp: 98.5 F (36.9 C) 97.6 F (36.4 C) 97.9 F (36.6 C) 97.6 F (36.4 C)  TempSrc: Oral Oral Oral Oral  Resp: 18 18 16 16   Height:      Weight:      SpO2:        LABS:  Recent Labs  02/26/14 0725  WBC 6.9  HGB 12.2  PLT 168   Blood type: --/--/A POS, A POS (06/11 0725) Rubella: Immune (11/17 0000)                I&O: Intake/Output     06/12 0701 - 06/13 0700 06/13 0701 - 06/14 0700   Urine (mL/kg/hr)     Blood     Total Output       Net              Physical Exam: Alert and oriented X3 Abdomen: soft, non-tender, non-distended  Fundus: firm, non-tender, U-3 Perineum: Well approximated, no significant erythema, edema, or drainage; healing well. Lochia: small Extremities: no edema, no calf pain or tenderness    Assessment: PPD # 2 G4P3013/ S/P:induced vaginal, 2nd degree laceration Doing well - stable for discharge home   Plan: Discharge home RX's:  Ibuprofen 600mg  po Q 6 hrs prn pain #30 Refill x 0 Routine pp visit in [facility] booklet given    Donette LarryBHAMBRI, Ostin Mathey, N MSN, CNM 02/28/2014, 9:57 AM

## 2014-02-28 NOTE — Discharge Summary (Signed)
Reviewed and agree with note and plan. V.Candas Deemer, MD  

## 2014-03-03 ENCOUNTER — Inpatient Hospital Stay (HOSPITAL_COMMUNITY): Admission: AD | Admit: 2014-03-03 | Payer: Self-pay | Source: Ambulatory Visit | Admitting: Obstetrics

## 2014-03-06 NOTE — Progress Notes (Signed)
Post discharge chart review completed.  

## 2014-07-20 ENCOUNTER — Encounter (HOSPITAL_COMMUNITY): Payer: Self-pay

## 2017-08-14 LAB — OB RESULTS CONSOLE GC/CHLAMYDIA
Chlamydia: NEGATIVE
Gonorrhea: NEGATIVE

## 2017-08-14 LAB — OB RESULTS CONSOLE ABO/RH: RH Type: POSITIVE

## 2017-08-14 LAB — OB RESULTS CONSOLE RPR: RPR: NONREACTIVE

## 2017-08-14 LAB — OB RESULTS CONSOLE HEPATITIS B SURFACE ANTIGEN: Hepatitis B Surface Ag: NEGATIVE

## 2017-08-14 LAB — OB RESULTS CONSOLE ANTIBODY SCREEN: ANTIBODY SCREEN: NEGATIVE

## 2017-08-14 LAB — OB RESULTS CONSOLE RUBELLA ANTIBODY, IGM: RUBELLA: IMMUNE

## 2017-08-14 LAB — OB RESULTS CONSOLE HIV ANTIBODY (ROUTINE TESTING): HIV: NONREACTIVE

## 2017-09-18 NOTE — L&D Delivery Note (Signed)
Delivery Note At 4:44 PM a viable female was delivered via Vaginal, Spontaneous (Presentation: DOA;  ).  APGAR: 8, 9; weight  pending.   Placenta status: intact, complete, .  Cord: 3VC with the following complications: none .  Cord pH: n/a  Anesthesia:  epidural Episiotomy: None Lacerations: 2nd degree Suture Repair: 3.0 vicryl rapide Est. Blood Loss (mL): 350  Mom to postpartum.  Baby to Couplet care / Skin to Skin.  Lendon ColonelKelly A Torah Pinnock 03/01/2018, 5:11 PM

## 2018-02-06 LAB — OB RESULTS CONSOLE GBS: GBS: NEGATIVE

## 2018-02-15 ENCOUNTER — Encounter (HOSPITAL_COMMUNITY): Payer: Self-pay | Admitting: *Deleted

## 2018-02-15 ENCOUNTER — Telehealth (HOSPITAL_COMMUNITY): Payer: Self-pay | Admitting: *Deleted

## 2018-02-15 ENCOUNTER — Other Ambulatory Visit: Payer: Self-pay | Admitting: Obstetrics

## 2018-02-15 NOTE — Telephone Encounter (Signed)
Preadmission screen  

## 2018-02-21 ENCOUNTER — Encounter (HOSPITAL_COMMUNITY): Payer: Self-pay | Admitting: *Deleted

## 2018-02-21 ENCOUNTER — Telehealth (HOSPITAL_COMMUNITY): Payer: Self-pay | Admitting: *Deleted

## 2018-02-21 NOTE — Telephone Encounter (Signed)
Preadmission screen  

## 2018-02-28 NOTE — H&P (Signed)
Brittany [name] is a 37 y.o. [id number] at 3068w2d presenting for IOL. Pt notes continued contractions. Good fetal movement, No vaginal bleeding, not leaking fluid.  [facility] - unplanned but desired pregnancy - h/o polyhydramnios in 2 prior preg w/ IOL, easy IOL/ fast labors after AROM. Borderline high AFI this preg, AGA  - AMA, nl NT, nl AFP   Prenatal Transfer Tool  Maternal Diabetes: No Genetic Screening: Normal Maternal Ultrasounds/Referrals: Normal Fetal Ultrasounds or other Referrals:  None Maternal Substance Abuse:  No Significant Maternal Medications:  None Significant Maternal Lab Results: None     OB History    Gravida  5   Para  3   Term  3   Preterm      AB  1   Living  3     SAB  1   TAB      Ectopic      Multiple      Live Births  2          Past Medical History:  Diagnosis Date  . Elderly primigravida, antepartum   . Family history of other endocrine and metabolic diseases(V18.19)   . Hx of Hashimoto thyroiditis   . Hypoglycemia, unspecified   . PONV (postoperative nausea and vomiting)    hypotension with epidurals  . Umbilical hernia without mention of obstruction or gangrene    Past Surgical History:  Procedure Laterality Date  . BACK SURGERY    . HERNIA REPAIR    . UMBILICAL HERNIA REPAIR    . WISDOM TOOTH EXTRACTION     Family History: family history includes Asthma in her mother and son; Bipolar disorder in her sister; Birth defects in her maternal grandfather; COPD in her paternal grandfather; Cancer in her father and mother; Depression in her paternal grandmother; Luiz BlareGraves' disease in her maternal grandfather; Heart disease in her paternal grandfather; Hypertension in her father; Hypothyroidism in her maternal grandmother, mother, and sister; Kidney Stones in her father and paternal grandfather; Stroke in her maternal grandfather; Varicose Veins in her maternal grandmother and mother. Social History:   reports that she has never smoked. She does not have any smokeless tobacco history on file. Her alcohol and drug histories are not on file.  Review of Systems - Negative except discomfort of preg/ BH ctx     Prenatal labs: ABO, Rh: A/Positive/-- (11/27 0000) Antibody: Negative (11/27 0000) Rubella: Immune (11/27 0000) RPR: Nonreactive (11/27 0000)  HBsAg: Negative (11/27 0000)  HIV: Non-reactive (11/27 0000)  GBS: Negative (05/22 0000)  1 hr Glucola 107  Genetic screening nl AFP, nl NT Anatomy US normal   Assessment/Plan: 37 y.o. [id number] at 4068w2d - term, multip, prior IOL, for elective IOL. H/o rapid labors after AROM, will AROM once head engaged. Pt aware risks of cord prolpase, c/s with IOL- GBS neg - AMA   Lendon ColonelKelly [name] 02/28/2018, 11:20 PM

## 2018-03-01 ENCOUNTER — Other Ambulatory Visit: Payer: Self-pay

## 2018-03-01 ENCOUNTER — Inpatient Hospital Stay (HOSPITAL_COMMUNITY): Payer: Managed Care, Other (non HMO) | Admitting: Anesthesiology

## 2018-03-01 ENCOUNTER — Inpatient Hospital Stay (HOSPITAL_COMMUNITY)
Admission: RE | Admit: 2018-03-01 | Discharge: 2018-03-03 | DRG: 807 | Disposition: A | Discharge status: Home / Self Care | Payer: Managed Care, Other (non HMO) | Attending: Obstetrics | Admitting: Obstetrics

## 2018-03-01 ENCOUNTER — Encounter (HOSPITAL_COMMUNITY): Payer: Self-pay

## 2018-03-01 DIAGNOSIS — Z3A39 39 weeks gestation of pregnancy: Secondary | ICD-10-CM

## 2018-03-01 DIAGNOSIS — Z3490 Encounter for supervision of normal pregnancy, unspecified, unspecified trimester: Secondary | ICD-10-CM

## 2018-03-01 DIAGNOSIS — O26893 Other specified pregnancy related conditions, third trimester: Secondary | ICD-10-CM | POA: Diagnosis present

## 2018-03-01 LAB — CBC
HCT: 35 % — ABNORMAL LOW (ref 36.0–46.0)
HEMOGLOBIN: 11.8 g/dL — AB (ref 12.0–15.0)
MCH: 30.3 pg (ref 26.0–34.0)
MCHC: 33.7 g/dL (ref 30.0–36.0)
MCV: 89.7 fL (ref 78.0–100.0)
Platelets: 148 10*3/uL — ABNORMAL LOW (ref 150–400)
RBC: 3.9 MIL/uL (ref 3.87–5.11)
RDW: 14 % (ref 11.5–15.5)
WBC: 6.2 10*3/uL (ref 4.0–10.5)

## 2018-03-01 LAB — TYPE AND SCREEN
ABO/RH(D): A POS
Antibody Screen: NEGATIVE

## 2018-03-01 LAB — RPR: RPR: NONREACTIVE

## 2018-03-01 MED ORDER — OXYTOCIN 40 UNITS IN LACTATED RINGERS INFUSION - SIMPLE MED
1.0000 m[IU]/min | INTRAVENOUS | Status: DC
  Administered 2018-03-01: 2 m[IU]/min via INTRAVENOUS
  Filled 2018-03-01: qty 1000

## 2018-03-01 MED ORDER — ZOLPIDEM TARTRATE 5 MG PO TABS: 5.0000 mg | ORAL_TABLET | Freq: Every evening | ORAL | Status: DC | PRN

## 2018-03-01 MED ORDER — FENTANYL 2.5 MCG/ML BUPIVACAINE 1/10 % EPIDURAL INFUSION (WH - ANES)
14.0000 mL/h | INTRAMUSCULAR | Status: DC | PRN
  Administered 2018-03-01: 12 mL/h via EPIDURAL
  Filled 2018-03-01: qty 100

## 2018-03-01 MED ORDER — LIDOCAINE HCL (PF) 1 % IJ SOLN
INTRAMUSCULAR | Status: AC
  Filled 2018-03-01: qty 30

## 2018-03-01 MED ORDER — COCONUT OIL OIL: 1.0000 "application " | TOPICAL_OIL | Status: DC | PRN

## 2018-03-01 MED ORDER — DIPHENHYDRAMINE HCL 25 MG PO CAPS: 25.0000 mg | ORAL_CAPSULE | Freq: Four times a day (QID) | ORAL | Status: DC | PRN

## 2018-03-01 MED ORDER — IBUPROFEN 600 MG PO TABS
600.0000 mg | ORAL_TABLET | Freq: Four times a day (QID) | ORAL | Status: DC
  Administered 2018-03-01 – 2018-03-03 (×7): 600 mg via ORAL
  Filled 2018-03-01 (×7): qty 1

## 2018-03-01 MED ORDER — PHENYLEPHRINE 40 MCG/ML (10ML) SYRINGE FOR IV PUSH (FOR BLOOD PRESSURE SUPPORT)
80.0000 ug | PREFILLED_SYRINGE | INTRAVENOUS | Status: DC | PRN
  Filled 2018-03-01: qty 10
  Filled 2018-03-01: qty 5

## 2018-03-01 MED ORDER — OXYCODONE HCL 5 MG PO TABS: 5.0000 mg | ORAL_TABLET | ORAL | Status: DC | PRN

## 2018-03-01 MED ORDER — SOD CITRATE-CITRIC ACID 500-334 MG/5ML PO SOLN: 30.0000 mL | ORAL | Status: DC | PRN

## 2018-03-01 MED ORDER — ACETAMINOPHEN 325 MG PO TABS: 650.0000 mg | ORAL_TABLET | ORAL | Status: DC | PRN

## 2018-03-01 MED ORDER — ACETAMINOPHEN 325 MG PO TABS
650.0000 mg | ORAL_TABLET | ORAL | Status: DC | PRN
Start: 2018-03-01 — End: 2018-03-03

## 2018-03-01 MED ORDER — EPHEDRINE 5 MG/ML INJ
10.0000 mg | INTRAVENOUS | Status: DC | PRN
  Filled 2018-03-01: qty 2

## 2018-03-01 MED ORDER — PRENATAL MULTIVITAMIN CH
1.0000 | ORAL_TABLET | Freq: Every day | ORAL | Status: DC
  Administered 2018-03-02 – 2018-03-03 (×2): 1 via ORAL
  Filled 2018-03-01 (×2): qty 1

## 2018-03-01 MED ORDER — PHENYLEPHRINE 40 MCG/ML (10ML) SYRINGE FOR IV PUSH (FOR BLOOD PRESSURE SUPPORT)
80.0000 ug | PREFILLED_SYRINGE | INTRAVENOUS | Status: DC | PRN
  Filled 2018-03-01: qty 5
  Filled 2018-03-01: qty 10

## 2018-03-01 MED ORDER — ONDANSETRON HCL 4 MG/2ML IJ SOLN
4.0000 mg | Freq: Four times a day (QID) | INTRAMUSCULAR | Status: DC | PRN
  Administered 2018-03-01: 4 mg via INTRAVENOUS
  Filled 2018-03-01: qty 2

## 2018-03-01 MED ORDER — BENZOCAINE-MENTHOL 20-0.5 % EX AERO
1.0000 "application " | INHALATION_SPRAY | CUTANEOUS | Status: DC | PRN
  Administered 2018-03-02 – 2018-03-03 (×2): 1 via TOPICAL
  Filled 2018-03-01 (×2): qty 56

## 2018-03-01 MED ORDER — OXYTOCIN BOLUS FROM INFUSION
500.0000 mL | Freq: Once | INTRAVENOUS | Status: AC
  Administered 2018-03-01: 500 mL via INTRAVENOUS

## 2018-03-01 MED ORDER — TETANUS-DIPHTH-ACELL PERTUSSIS 5-2.5-18.5 LF-MCG/0.5 IM SUSP: 0.5000 mL | Freq: Once | INTRAMUSCULAR | Status: DC

## 2018-03-01 MED ORDER — OXYCODONE HCL 5 MG PO TABS: 10.0000 mg | ORAL_TABLET | ORAL | Status: DC | PRN

## 2018-03-01 MED ORDER — ONDANSETRON HCL 4 MG/2ML IJ SOLN: 4.0000 mg | INTRAMUSCULAR | Status: DC | PRN

## 2018-03-01 MED ORDER — WITCH HAZEL-GLYCERIN EX PADS: 1.0000 "application " | MEDICATED_PAD | CUTANEOUS | Status: DC | PRN

## 2018-03-01 MED ORDER — LACTATED RINGERS IV SOLN
500.0000 mL | Freq: Once | INTRAVENOUS | Status: AC
  Administered 2018-03-01: 500 mL via INTRAVENOUS

## 2018-03-01 MED ORDER — DIBUCAINE 1 % RE OINT: 1.0000 "application " | TOPICAL_OINTMENT | RECTAL | Status: DC | PRN

## 2018-03-01 MED ORDER — LACTATED RINGERS IV SOLN
INTRAVENOUS | Status: DC
  Administered 2018-03-01 (×2): via INTRAVENOUS

## 2018-03-01 MED ORDER — TERBUTALINE SULFATE 1 MG/ML IJ SOLN
0.2500 mg | Freq: Once | INTRAMUSCULAR | Status: DC | PRN
  Filled 2018-03-01: qty 1

## 2018-03-01 MED ORDER — SENNOSIDES-DOCUSATE SODIUM 8.6-50 MG PO TABS
2.0000 | ORAL_TABLET | ORAL | Status: DC
  Administered 2018-03-01 – 2018-03-02 (×2): 2 via ORAL
  Filled 2018-03-01 (×2): qty 2

## 2018-03-01 MED ORDER — ONDANSETRON HCL 4 MG PO TABS: 4.0000 mg | ORAL_TABLET | ORAL | Status: DC | PRN

## 2018-03-01 MED ORDER — DIPHENHYDRAMINE HCL 50 MG/ML IJ SOLN: 12.5000 mg | INTRAMUSCULAR | Status: DC | PRN

## 2018-03-01 MED ORDER — LACTATED RINGERS IV SOLN
500.0000 mL | INTRAVENOUS | Status: DC | PRN
  Administered 2018-03-01: 300 mL via INTRAVENOUS

## 2018-03-01 MED ORDER — SIMETHICONE 80 MG PO CHEW
80.0000 mg | CHEWABLE_TABLET | ORAL | Status: DC | PRN
Start: 2018-03-01 — End: 2018-03-03

## 2018-03-01 MED ORDER — OXYTOCIN 40 UNITS IN LACTATED RINGERS INFUSION - SIMPLE MED: 2.5000 [IU]/h | INTRAVENOUS | Status: DC

## 2018-03-01 MED ORDER — LIDOCAINE HCL (PF) 1 % IJ SOLN
30.0000 mL | INTRAMUSCULAR | Status: DC | PRN
  Administered 2018-03-01: 30 mL via SUBCUTANEOUS
  Filled 2018-03-01: qty 30

## 2018-03-01 MED ORDER — LIDOCAINE HCL (PF) 1 % IJ SOLN
INTRAMUSCULAR | Status: DC | PRN
  Administered 2018-03-01 (×2): 4 mL via EPIDURAL

## 2018-03-01 NOTE — Anesthesia Pain Management Evaluation Note (Signed)
  CRNA Pain Management Visit Note  Patient: Brittany [name], 37 y.o., female  "Hello I am a member of the anesthesia team at [facility]. We have an anesthesia team available at all times to provide care throughout the hospital, including epidural management and anesthesia for C-section. I don't know your plan for the delivery whether it a natural birth, water birth, IV sedation, nitrous supplementation, doula or epidural, but we want to meet your pain goals."   1.Was your pain managed to your expectations on prior hospitalizations?   Yes   2.What is your expectation for pain management during this hospitalization?     Epidural  3.How can we help you reach that goal? epidural  Record the patient's initial score and the patient's pain goal.   Pain: 0  Pain Goal: 4 [facility] wants you to be able to say your pain was always managed very well.  [name] 03/01/2018

## 2018-03-01 NOTE — Anesthesia Procedure Notes (Signed)
Epidural Patient location during procedure: OB Start time: 03/01/2018 11:09 AM End time: 03/01/2018 11:13 AM  Staffing Anesthesiologist: Beryle LatheBrock, Lionardo Haze E, MD Performed: anesthesiologist   Preanesthetic Checklist Completed: patient identified, pre-op evaluation, timeout performed, IV checked, risks and benefits discussed and monitors and equipment checked  Epidural Patient position: sitting Prep: DuraPrep Patient monitoring: continuous pulse ox and blood pressure Approach: midline Location: L3-L4 Injection technique: LOR saline  Needle:  Needle type: Tuohy  Needle gauge: 17 G Needle length: 9 cm Needle insertion depth: 7 cm Catheter size: 19 Gauge Catheter at skin depth: 12 cm Test dose: negative and Other (1% lidocaine)  Additional Notes Patient identified. Risks including, but not limited to, bleeding, infection, nerve damage, paralysis, inadequate analgesia, blood pressure changes, nausea, vomiting, allergic reaction, postpartum back pain, itching, and headache were discussed. Patient expressed understanding and wished to proceed. Sterile prep and drape, including hand hygiene, mask, and sterile gloves were used. The patient was positioned and the spine was prepped. The skin was anesthetized with lidocaine. No paraesthesia or other complication noted. The patient did not experience any signs of intravascular injection such as tinnitus or metallic taste in mouth, nor signs of intrathecal spread such as rapid motor block. Please see nursing notes for vital signs. The patient tolerated the procedure well.   Leslye Peerhomas Ghali Morissette, MDReason for block:procedure for pain

## 2018-03-01 NOTE — Anesthesia Preprocedure Evaluation (Signed)
Anesthesia Evaluation  Patient identified by MRN, date of birth, ID band Patient awake    Reviewed: Allergy & Precautions, NPO status , Patient's Chart, lab work & pertinent test results  Airway Mallampati: II  TM Distance: >3 FB Neck ROM: Full    Dental  (+) Dental Advisory Given   Pulmonary neg pulmonary ROS,    breath sounds clear to auscultation       Cardiovascular negative cardio ROS   Rhythm:Regular Rate:Normal     Neuro/Psych negative neurological ROS  negative psych ROS   GI/Hepatic negative GI ROS, Neg liver ROS,   Endo/Other  Hx Hashimoto thyroiditis  Renal/GU negative Renal ROS  negative genitourinary   Musculoskeletal S/p Harrington Rods for scoliosis correction   Abdominal   Peds  Hematology  (+) anemia ,   Anesthesia Other Findings Hx hypotension with epidurals in past  Reproductive/Obstetrics                             Anesthesia Physical Anesthesia Plan  ASA: II  Anesthesia Plan: Epidural   Post-op Pain Management:    Induction:   PONV Risk Score and Plan: Treatment may vary due to age or medical condition  Airway Management Planned: Natural Airway  Additional Equipment: None  Intra-op Plan:   Post-operative Plan:   Informed Consent: I have reviewed the patients History and Physical, chart, labs and discussed the procedure including the risks, benefits and alternatives for the proposed anesthesia with the patient or authorized representative who has indicated his/her understanding and acceptance.     Plan Discussed with: Anesthesiologist  Anesthesia Plan Comments: (Labs reviewed. Platelets acceptable, patient not taking any blood thinning medications. Discussed seriousness of infection risk given indwelling lumbar hardware, as well as possibility of failed epidural due to lumbar surgery in past. All risks and benefits discussed with patient, patient  expressed understanding and wished to proceed.)        Anesthesia Quick Evaluation

## 2018-03-01 NOTE — Anesthesia Postprocedure Evaluation (Signed)
Anesthesia Post Note  Patient: Brittany [name]  Procedure(s) Performed: AN AD HOC LABOR EPIDURAL     Patient location during evaluation: Mother Baby Anesthesia Type: Epidural Level of consciousness: awake and alert Pain management: pain level controlled Vital Signs Assessment: post-procedure vital signs reviewed and stable Respiratory status: spontaneous breathing, nonlabored ventilation and respiratory function stable Cardiovascular status: stable Postop Assessment: no headache, no backache and epidural receding Anesthetic complications: no    Last Vitals:  Vitals:   03/01/18 1830 03/01/18 2010  BP: 115/70 100/63  Pulse: 72 85  Resp: 18 18  Temp: 36.9 C 36.9 C  SpO2: 100%     Last Pain:  Vitals:   03/01/18 2010  TempSrc: Axillary  PainSc: 0-No pain   Pain Goal:                 Jael Waldorf

## 2018-03-01 NOTE — Progress Notes (Signed)
S: Doing well, no complaints, pain well controlled with epidural  O: BP 109/71   Pulse 70   Temp 98 F (36.7 C) (Oral)   Resp 18   Ht 5\' 5"  (1.651 m)   Wt 75.2 kg (165 lb 11.2 oz)   SpO2 100%   BMI 27.57 kg/m    FHT:  FHR: 140s bpm, variability: moderate,  accelerations:  Present,  decelerations:  Absent UC:   regular, every 2-3 minutes, pit at 12 munits/min SVE:   Dilation: 2 Effacement (%): 20 Station: -3 Exam by:: Dr. Ernestina PennaFogleman  AROM- clear  Gen: well appearing, no distress Abd: soft, gravid, NT LE: NT, no edema  A / P:  37 y.o.  OB History  Gravida Para Term Preterm AB Living  5 3 3  0 1 3  SAB TAB Ectopic Multiple Live Births  1 0 0 0 2   at 7653w2d IOL, term, multip  Fetal Wellbeing:  Category I Pain Control:  Epidural  Anticipated MOD:  NSVD  Lendon ColonelKelly A Jahnavi Muratore 03/01/2018, 12:15 PM

## 2018-03-01 NOTE — H&P (Signed)
Brittany [name] is a 37 y.o. [id number] at [redacted]w[redacted]d presenting for IOL. Pt notes continued contractions. Good fetal movement, No vaginal bleeding, not leaking fluid.  [facility]are at [facility] since 8 wks - unplanned but desired pregnancy - h/o polyhydramnios in 2 prior preg w/ IOL, easy IOL/ fast labors after AROM. Borderline high AFI this preg, AGA  - AMA, nl NT, nl AFP   Prenatal Transfer Tool  Maternal Diabetes: No Genetic Screening: Normal Maternal Ultrasounds/Referrals: Normal Fetal Ultrasounds or other Referrals:  None Maternal Substance Abuse:  No Significant Maternal Medications:  None Significant Maternal Lab Results: None             OB History    Gravida  5   Para  3   Term  3   Preterm      AB  1   Living  3     SAB  1   TAB      Ectopic      Multiple      Live Births  2              Past Medical History:  Diagnosis Date  . Elderly primigravida, antepartum   . Family history of other endocrine and metabolic diseases(V18.19)   . Hx of Hashimoto thyroiditis   . Hypoglycemia, unspecified   . PONV (postoperative nausea and vomiting)    hypotension with epidurals  . Umbilical hernia without mention of obstruction or gangrene         Past Surgical History:  Procedure Laterality Date  . BACK SURGERY    . HERNIA REPAIR    . UMBILICAL HERNIA REPAIR    . WISDOM TOOTH EXTRACTION     Family History: family history includes Asthma in her mother and son; Bipolar disorder in her sister; Birth defects in her maternal grandfather; COPD in her paternal grandfather; Cancer in her father and mother; Depression in her paternal grandmother; Luiz Blare' disease in her maternal grandfather; Heart disease in her paternal grandfather; Hypertension in her father; Hypothyroidism in her maternal grandmother, mother, and sister; Kidney Stones in her father and paternal grandfather; Stroke in her maternal grandfather; Varicose Veins in her  maternal grandmother and mother. Social History:  reports that she has never smoked. She does not have any smokeless tobacco history on file. Her alcohol and drug histories are not on file.  Review of Systems - Negative except discomfort of preg/ BH ctx  PE: Vitals:   03/01/18 0728  BP: 117/75  Pulse: 73  Weight: 75.2 kg (165 lb 11.2 oz)  Height: 5\' 5"  (1.651 m)   CBC    Component Value Date/Time   WBC 6.9 02/26/2014 0725   RBC 3.89 02/26/2014 0725   HGB 12.2 02/26/2014 0725   HCT 35.9 (L) 02/26/2014 0725   PLT 168 02/26/2014 0725   MCV 92.3 02/26/2014 0725   MCH 31.4 02/26/2014 0725   MCHC 34.0 02/26/2014 0725   RDW 13.8 02/26/2014 0725        Prenatal labs: ABO, Rh: A/Positive/-- (11/27 0000) Antibody: Negative (11/27 0000) Rubella: Immune (11/27 0000) RPR: Nonreactive (11/27 0000)  HBsAg: Negative (11/27 0000)  HIV: Non-reactive (11/27 0000)  GBS: Negative (05/22 0000)  1 hr Glucola 107          Genetic screening nl AFP, nl NT Anatomy US normal   Assessment/Plan: 37 y.o. [id number] at [redacted]w[redacted]d - term, multip, prior IOL, for elective IOL. H/o rapid labors after AROM, will AROM once head engaged. Pt  aware risks of cord prolpase, c/s with IOL- GBS neg - AMA    Lendon ColonelKelly A Donnalyn Juran 03/01/2018 7:32 AM

## 2018-03-02 MED ORDER — ACETAMINOPHEN 325 MG PO TABS: 650.0000 mg | ORAL_TABLET | ORAL | Status: AC | PRN

## 2018-03-02 MED ORDER — BENZOCAINE-MENTHOL 20-0.5 % EX AERO: 1.0000 "application " | INHALATION_SPRAY | CUTANEOUS | Status: AC | PRN

## 2018-03-02 MED ORDER — IBUPROFEN 600 MG PO TABS: 600.0000 mg | ORAL_TABLET | Freq: Four times a day (QID) | ORAL | 0 refills | Status: AC

## 2018-03-02 MED ORDER — COCONUT OIL OIL: 1.0000 "application " | TOPICAL_OIL | 0 refills | Status: AC | PRN

## 2018-03-02 NOTE — Lactation Note (Signed)
This note was copied from a baby's chart. Lactation Consultation Note Baby 11 hrs old. Mom stated that baby is BF well, may be cluster feeding some for comfort.  Mom BF her other 3 children 3 months each. Mom hopes to BF this baby longer d/t working part-time instead of full time.  Mom denied difficulty BF her other children. Mom has small round breast w/everted nipples.  Newborn behavior, STS, I&O, cluster feeding discussed. Mom encouraged to feed baby 8-12 times/24 hours and with feeding cues.  [facility] brochure given w/resources, support groups and LC services.  Patient Name: Brittany [name] ZOXWR'UToday's Date: 03/02/2018 Reason for consult: Initial assessment   Maternal Data Has patient been taught Hand Expression?: Yes Does the patient have breastfeeding experience prior to this delivery?: Yes  Feeding Feeding Type: Breast Fed Length of feed: 15 min  LATCH Score       Type of Nipple: Everted at rest and after stimulation  Comfort (Breast/Nipple): Soft / non-tender  Hold (Positioning): No assistance needed to correctly position infant at breast.     Interventions Interventions: Breast feeding basics reviewed  Lactation Tools Discussed/Used WIC Program: No   Consult Status Consult Status: Follow-up Date: 03/03/18 Follow-up type: In-patient    Charyl DancerCARVER, Kaizer Dissinger G 03/02/2018, 4:16 AM

## 2018-03-02 NOTE — Progress Notes (Signed)
PPD # 1 SVD Information for the patient's newborn:  Maximino Greenlandlotkowski, Girl Karen KaysMelonie [[id number]][[id number]]  female      breast feeding  Baby name: Angelique HolmMadeline  S:  Reports feeling well, desires DC home today.             Tolerating po/ No nausea or vomiting             Bleeding is light, moderate             Pain controlled with ibuprofen (OTC)             Up ad lib / ambulatory / voiding without difficulties        O:  A & O x 3, in no apparent distress              VS:  Vitals:   03/01/18 1830 03/01/18 2010 03/01/18 2349 03/02/18 0600  BP: 115/70 100/63 103/68 105/69  Pulse: 72 85 68 68  Resp: 18 18 18 18   Temp: 98.5 F (36.9 C) 98.4 F (36.9 C) 98.5 F (36.9 C) 97.7 F (36.5 C)  TempSrc: Oral Axillary Oral Oral  SpO2: 100%     Weight:      Height:        LABS:  Recent Labs    03/01/18 0744  WBC 6.2  HGB 11.8*  HCT 35.0*  PLT 148*    Blood type: --/--/A POS (06/14 0744)  Rubella: Immune (11/27 0000)   I&O: I/O last 3 completed shifts: In: -  Out: 1600 [Urine:1250; Blood:350]          No intake/output data recorded.  Lungs: Clear and unlabored  Heart: regular rate and rhythm / no murmurs  Abdomen: soft, non-tender, non-distended             Fundus: firm, non-tender, U-1  Perineum: repair intact, no edema  Lochia: small  Extremities: no edema, no calf pain or tenderness    A/P: PPD # 1 37 y.o., [id number]   Principal Problem:   SVD 6/14 Active Problems:   Postpartum care following vaginal delivery   Second-degree perineal laceration, with delivery   Doing well - stable status  Routine post partum orders             DC home today w/ instructions  [facility] and PRN     Neta Mendsaniela C Darious Rehman, MSN, CNM 03/02/2018, 10:06 AM

## 2018-03-02 NOTE — Discharge Summary (Addendum)
Obstetric Discharge Summary Reason for Admission: onset of labor and induction of labor Prenatal Procedures: ultrasound Intrapartum Procedures: spontaneous vaginal delivery and epidural Postpartum Procedures: none Complications-Operative and Postpartum: 2nd degree perineal laceration Hemoglobin  Date Value Ref Range Status  03/01/2018 11.8 (L) 12.0 - 15.0 g/dL Final   HCT  Date Value Ref Range Status  03/01/2018 35.0 (L) 36.0 - 46.0 % Final    Physical Exam:  General: alert, cooperative and no distress Lochia: appropriate Uterine Fundus: firm Incision: healing well DVT Evaluation: No cords or calf tenderness. No significant calf/ankle edema.  Discharge Diagnoses:  Patient Active Problem List   Diagnosis Date Noted  . SVD 6/14 03/02/2018  . Second-degree perineal laceration, with delivery 03/02/2018  . Postpartum care following vaginal delivery 02/27/2014     Discharge Information: Date: 03/03/2018 Activity: pelvic rest Diet: routine Medications:  Allergies as of 03/02/2018      Reactions   Dairy Aid [lactase] Nausea Only   Gluten Meal Nausea Only      Medication List    TAKE these medications   acetaminophen 325 MG tablet Commonly known as:  TYLENOL Take 2 tablets (650 mg total) by mouth every 4 (four) hours as needed (for pain scale < 4).   benzocaine-Menthol 20-0.5 % Aero Commonly known as:  DERMOPLAST Apply 1 application topically as needed for irritation (perineal discomfort).   coconut oil Oil Apply 1 application topically as needed.   ibuprofen 600 MG tablet Commonly known as:  ADVIL,MOTRIN Take 1 tablet (600 mg total) by mouth every 6 (six) hours.   prenatal multivitamin Tabs tablet Take 1 tablet by mouth daily at 12 noon.            Discharge Care Instructions  (From admission, onward)        Start     Ordered   03/02/18 0000  Discharge wound care:    Comments:  Sitz baths 2 times /day with warm water x 1 week   03/02/18 1148      Condition: stable Instructions: refer to practice specific booklet Discharge to: home Follow-up Information    Noland FordyceFogleman, Kelly, MD. Schedule an appointment as soon as possible for a visit in 6 week(s).   Specialty:  Obstetrics and Gynecology Contact information: [address] [place] [address] [phone]           Newborn Data: Live born female Sheran LawlessMadeline Birth Weight: 7 lb 8.1 oz (3405 g) APGAR: 8, 9  Newborn Delivery   Birth date/time:  03/01/2018 16:44:00 Delivery type:  Vaginal, Spontaneous     Home with mother.  Neta MendsDaniela C Veora Fonte, CNM 03/02/2018, 10:09 AM   Remained inpatient additional day for newborn observation of bili levels Discharged home stable on 03/03/2018  Neta Mendsaniela C Sharlett Lienemann, MSN, CNM 03/03/2018, 9:14 AM

## 2018-03-06 ENCOUNTER — Inpatient Hospital Stay (HOSPITAL_COMMUNITY)
Admission: AD | Admit: 2018-03-06 | Payer: Managed Care, Other (non HMO) | Source: Ambulatory Visit | Admitting: Obstetrics
